# Patient Record
Sex: Female | Born: 1990 | Race: White | Hispanic: No | Marital: Single | Smoking: Former smoker
Health system: Southern US, Community
[De-identification: ages and names within clinical notes are randomized; demographics above are authoritative.]

## PROBLEM LIST (undated history)

## (undated) DIAGNOSIS — F1910 Other psychoactive substance abuse, uncomplicated: Secondary | ICD-10-CM

## (undated) DIAGNOSIS — J45909 Unspecified asthma, uncomplicated: Secondary | ICD-10-CM

## (undated) HISTORY — PX: TONSILLECTOMY: SUR1361

## (undated) HISTORY — PX: ADENOIDECTOMY: SUR15

## (undated) HISTORY — PX: WISDOM TOOTH EXTRACTION: SHX21

---

## 2016-02-28 ENCOUNTER — Emergency Department (HOSPITAL_COMMUNITY)
Admission: EM | Admit: 2016-02-28 | Discharge: 2016-02-28 | Disposition: A | Discharge status: Home / Self Care | Payer: Self-pay | Attending: Emergency Medicine | Admitting: Emergency Medicine

## 2016-02-28 ENCOUNTER — Encounter (HOSPITAL_COMMUNITY): Payer: Self-pay | Admitting: *Deleted

## 2016-02-28 DIAGNOSIS — Z880 Allergy status to penicillin: Secondary | ICD-10-CM | POA: Insufficient documentation

## 2016-02-28 DIAGNOSIS — J45909 Unspecified asthma, uncomplicated: Secondary | ICD-10-CM | POA: Insufficient documentation

## 2016-02-28 DIAGNOSIS — F1123 Opioid dependence with withdrawal: Secondary | ICD-10-CM | POA: Insufficient documentation

## 2016-02-28 DIAGNOSIS — F1193 Opioid use, unspecified with withdrawal: Secondary | ICD-10-CM

## 2016-02-28 DIAGNOSIS — F17200 Nicotine dependence, unspecified, uncomplicated: Secondary | ICD-10-CM | POA: Insufficient documentation

## 2016-02-28 HISTORY — DX: Unspecified asthma, uncomplicated: J45.909

## 2016-02-28 MED ORDER — BACLOFEN 10 MG PO TABS
5.0000 mg | ORAL_TABLET | Freq: Three times a day (TID) | ORAL | Status: DC
Start: 2016-02-28 — End: 2016-11-24

## 2016-02-28 MED ORDER — DIPHENOXYLATE-ATROPINE 2.5-0.025 MG PO TABS: 2.0000 | ORAL_TABLET | Freq: Four times a day (QID) | ORAL | Status: DC | PRN

## 2016-02-28 MED ORDER — PROMETHAZINE HCL 25 MG PO TABS: 25.0000 mg | ORAL_TABLET | Freq: Four times a day (QID) | ORAL | Status: DC | PRN

## 2016-02-28 NOTE — ED Notes (Signed)
Pt states that she is requesting Detox from Heroin; pt states that she last used 1.5 days ago; pt denies SI / HI

## 2016-02-28 NOTE — ED Provider Notes (Signed)
CSN: [id number]     Arrival date & time 02/28/16  2217 History   First MD Initiated Contact with Patient 02/28/16 2240     Chief Complaint  Patient presents with  . 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 Last use was a day and a half ago. Denies any suicidal homicidal ideations. Injects heroin into her left antecubital fossa. Has had some myalgias but denies any vomiting or diarrhea. No hallucinations. Has never been in rehabilitation before the past. Does not use alcohol daily. No treatment use prior to arrival  The history is provided by the patient and a parent.    Past Medical History  Diagnosis Date  . Asthma    History reviewed. No pertinent past surgical history. No family history on file. Social History  Substance Use Topics  . Smoking status: Current Every Day Smoker  . Smokeless tobacco: None  . Alcohol Use: Yes   OB History    No data available     Review of Systems  All other systems reviewed and are negative.     Allergies  Clindamycin/lincomycin and Penicillins  Home Medications   Prior to Admission medications   Not on File   BP 115/89 mmHg  Pulse 87  Temp(Src) 98.1 F (36.7 C) (Oral)  Resp 18  SpO2 97% Physical Exam  Constitutional: She is oriented to person, place, and time. She appears well-developed and well-nourished.  Non-toxic appearance. No distress.  HENT:  Head: Normocephalic and atraumatic.  Eyes: Conjunctivae, EOM and lids are normal. Pupils are equal, round, and reactive to light.  Neck: Normal range of motion. Neck supple. No tracheal deviation present. No thyroid mass present.  Cardiovascular: Normal rate, regular rhythm and normal heart sounds.  Exam reveals no gallop.   No murmur heard. Pulmonary/Chest: Effort normal and breath sounds normal. No stridor. No respiratory distress. She has no decreased  breath sounds. She has no wheezes. She has no rhonchi. She has no rales.  Abdominal: Soft. Normal appearance and bowel sounds are normal. She exhibits no distension. There is no tenderness. There is no rebound and no CVA tenderness.  Musculoskeletal: Normal range of motion. She exhibits no edema or tenderness.  Neurological: She is alert and oriented to person, place, and time. She has normal strength. No cranial nerve deficit or sensory deficit. GCS eye subscore is 4. GCS verbal subscore is 5. GCS motor subscore is 6.  Skin: Skin is warm and dry. No abrasion and no rash noted.  Psychiatric: Her speech is normal and behavior is normal. Her affect is blunt. She expresses no suicidal plans and no homicidal plans.  Nursing note and vitals reviewed.   ED Course  Procedures (including critical care time) Labs Review Labs Reviewed - No data to display  Imaging Review No results found. I have personally reviewed and evaluated these images and lab results as part of my medical decision-making.   EKG Interpretation None      MDM   Final diagnoses:  None    Lengthy discussion with patient and her family. And we'll treat patient for symptomatic opiate withdrawal and give outpatient referrals    Lorre NickAnthony Esmirna Ravan, MD 02/28/16 2259

## 2016-02-28 NOTE — Discharge Instructions (Signed)
Opioid Withdrawal Opioids are a group of narcotic drugs. They include the street drug heroin. They also include pain medicines, such as morphine, hydrocodone, oxycodone, and fentanyl. Opioid withdrawal is a group of characteristic physical and mental signs and symptoms. It typically occurs if you have been using opioids daily for several weeks or longer and stop using or rapidly decrease use. Opioid withdrawal can also occur if you have used opioids daily for a long time and are given a medicine to block the effect.  SIGNS AND SYMPTOMS Opioid withdrawal includes three or more of the following symptoms:   Depressed, anxious, or irritable mood.  Nausea or vomiting.  Muscle aches or spasms.   Watery eyes.   Runny nose.  Dilated pupils, sweating, or hairs standing on end.  Diarrhea or intestinal cramping.  Yawning.   Fever.  Increased blood pressure.  Fast pulse.  Restlessness or trouble sleeping. These signs and symptoms occur within several hours of stopping or reducing short-acting opioids, such as heroin. They can occur within 3 days of stopping or reducing long-acting opioids, such as methadone. Withdrawal begins within minutes of receiving a drug that blocks the effects of opioids, such as naltrexone or naloxone. DIAGNOSIS  Opioid use disorder is diagnosed by your health care provider. You will be asked about your symptoms, drug and alcohol use, medical history, and use of medicines. A physical exam may be done. Lab tests may be ordered. Your health care provider may have you see a mental health professional.  TREATMENT  The treatment for opioid withdrawal is usually provided by medical doctors with special training in substance use disorders (addiction specialists). The following medicines may be included in treatment:  Opioids given in place of the abused opioid. They turn on opioid receptors in the brain and lessen or prevent withdrawal symptoms. They are gradually  decreased (opioid substitution and taper).  Non-opioids that can lessen certain opioid withdrawal symptoms. They may be used alone or with opioid substitution and taper. Successful long-term recovery usually requires medicine, counseling, and group support. HOME CARE INSTRUCTIONS   Take medicines only as directed by your health care provider.  Check with your health care provider before starting new medicines.  Keep all follow-up visits as directed by your health care provider. SEEK MEDICAL CARE IF:  You are not able to take your medicines as directed.  Your symptoms get worse.  You relapse. SEEK IMMEDIATE MEDICAL CARE IF:  You have serious thoughts about hurting yourself or others.  You have a seizure.  You lose consciousness.   This information is not intended to replace advice given to you by your health care provider. Make sure you discuss any questions you have with your health care provider.   Document Released: 11/06/2003 Document Revised: 11/24/2014 Document Reviewed: 11/16/2013 Elsevier Interactive Patient Education 2016 [employer]. [employer] Guide Outpatient Counseling/Substance Abuse Adult The United Ways 211 is a great source of information about community services available.  Access by dialing 2-1-1 from anywhere in [place], or by website -  PooledIncome.pl.   Other Local Resources (Updated 11/2015)  Crisis Hotlines   Services     Area Served  [employer]  Crisis Hotline, available 24 hours a day, 7 days a week: 620-467-2000 [facility], [place]   [facility]  Crisis Hotline, available 24 hours a day, 7 days a week: 9850515319 [facility], [place]  [facility]  Suicide Prevention Hotline, available 24 hours a day, 7 days a week: 902-389-6858 [facility]  [place], [place]  [employer], available 24 hours a day, 7 days a week: 612-042-7284 [facility], [place]   [facility] Access to  [place] Hotline, available 24 hours a day, 7 days a week: 478-016-6738 All   [facility]  Crisis Hotline, available 24 hours a day, 7 days a week: 808-194-4721 All   Other Local Resources (Updated 11/2015)  Outpatient Counseling/ Substance Abuse Programs  Services     Address and Phone Number  ADS (Alcohol and Drug Services)   Options include Individual counseling, group counseling, intensive outpatient program (several hours a day, several days a week)  Offers depression assessments  Provides methadone maintenance program [phone] [address] [place], [place] [address]   [facility]   Offers partial hospitalization/day treatment and DUI/DWI programs  [employer], private insurance [phone] [address] [place], [place] [address]  [facility]    Services include intensive outpatient program (several hours a day, several days a week), outpatient treatment, DUI/DWI services, family education  Also has some services specifically for [employer] transitional housing  [phone] [address] [place], [place] [address]     [facility]  [employer], private pay, and private insurance [phone] [address] [place], [place] [address]  [employer] [facility]  Services include individual counseling, substance abuse intensive outpatient program (several hours a day, several days a week), day treatment  Delene Loll, Medicaid, private insurance [phone] [address], [address] [place], [place] [address]  [facility]   Offers substance abuse intensive outpatient program (several hours a day, several days a week), partial hospitalization program [phone] [address] [place], [place] [address]  [phone] [address] [place], [place] [address]  [phone] [address] [place], [place] [address]  [phone], [address] [place], [place] [address]  [facility]  Individual counseling only  Accepts private insurance only [phone] [address] [place], [place] [address]  [facility]  Methadone maintenance program [phone] [address] [place], [place] [address]  [facility]  Walk-In Clinic providing substance abuse and mental health counseling  Accepts Medicaid, Medicare, private insurance  Offers sliding scale for uninsured [phone] [address] [place], [place]   [facility] [place], [employer].  Offers individual counseling, and intensive in-home services [phone] [address] [place], [place] [address]  [facility] individual counseling, family counseling, group therapy, domestic violence counseling, consumer credit counseling  Accepts Medicare, Medicaid, private insurance  Offers sliding scale for uninsured [phone] [address] [place], [place] [address]  [phone] [facility], [address] [place], [place] [address]  [facility]  Offers individual, family and group counseling  3 locations - [place], [place], and [place]  [phone]  [address] [place], [place] [address]  [address] [place], [place] [address]  [address] [place], [place] [address]  [facility]    Offers psychiatric assessment, 8-week Intensive Outpatient Program (several hours a day, several times a week, daytime or evenings), early recovery group, family Program, medication management  Private pay or private insurance only [phone], or  [phone] [address] [place], [place] [address]  [facility] [place] [employer] individual, couples and family counseling  Accepts Medicaid, private insurance, and sliding scale for uninsured [phone] [address] [place], [place] [address]  Len Blalockavid  Fuller, MD  Individual counseling  Private insurance [phone] [address] [place], [place] [address]  [facility]   Offers assessment, substance abuse treatment, and behavioral health treatment [phone] [address] [place], [place] [address]  [facility]  Individual counseling  Accepts private insurance [phone] [address] [place], [place] [address]  [facility]  Individual counseling  Delene Lollccepts Medicare, private insurance [phone] [address] [place], [place] [address]  [facility]    Offers intensive outpatient program (several hours a day, several times a week)  Private pay, private insurance [phone] [facility] [place], [place]  [facility]  Individual counseling  Medicare, private insurance [phone] [address], [address] [place], [place] [address]  [facility]    Offers intensive outpatient program (several hours a day, several times a week) and partial hospitalization program [phone] [address] [place], [place] [address]  Emerson MonteParrish McKinney, MD  Individual counseling [phone] [address], [address] [place], [place] [address]  [facility]  Offers Christian counseling to individuals, couples, and families  Accepts Medicare and private insurance; offers sliding scale for uninsured [phone] [place], [place] [address]  [facility]  [facility] [phone] [address] [place], [place] [address]  [facility]   Offers crisis counseling, individual counseling, group therapy, in-home therapy, domestic violence services, day treatment, DWI services, Administrator, artsCommunity Support Team (CST), Assertive Community Treatment Team (ACTT), substance abuse Intensive Outpatient Program (several hours a day, several  times a week)  2 locations - [place] and [place] [phone] [address] [place], [place] [address]  [phone] [address] [place], [place] [address]  [facility]     Individual counseling and group therapy  Accepts private insurance, [place]icare, [place] [phone] [address] [place], [place]  [facility]  Offers individual and family counseling  Offers LGBTQ services  Accepts private insurance and private pay [phone] [address] [place], [place] [address]  [facility]    Offers individual counseling, group therapy, and outpatient detox  Accepts private insurance [phone] [address] [place], [place]  [facility]  Individual counseling  Accepts Medicare, private insurance [phone] [address] [place], [place] [address]  [facility]  Individual counseling  Accepts Medicare, private insurance [phone] [address] [place], [place] [address]  [facility]   Offers substance abuse Intensive Outpatient Program (several hours a day, several times a week) [phone], or [phone] [place], [place]

## 2016-11-18 ENCOUNTER — Emergency Department (HOSPITAL_COMMUNITY): Payer: 59

## 2016-11-18 ENCOUNTER — Encounter (HOSPITAL_COMMUNITY): Payer: Self-pay

## 2016-11-18 ENCOUNTER — Emergency Department (HOSPITAL_COMMUNITY)
Admission: EM | Admit: 2016-11-18 | Discharge: 2016-11-18 | Disposition: A | Discharge status: Home / Self Care | Payer: 59 | Attending: Emergency Medicine | Admitting: Emergency Medicine

## 2016-11-18 DIAGNOSIS — Z87891 Personal history of nicotine dependence: Secondary | ICD-10-CM | POA: Diagnosis not present

## 2016-11-18 DIAGNOSIS — J45909 Unspecified asthma, uncomplicated: Secondary | ICD-10-CM | POA: Diagnosis not present

## 2016-11-18 DIAGNOSIS — Z91010 Allergy to peanuts: Secondary | ICD-10-CM | POA: Insufficient documentation

## 2016-11-18 DIAGNOSIS — T402X1A Poisoning by other opioids, accidental (unintentional), initial encounter: Secondary | ICD-10-CM | POA: Insufficient documentation

## 2016-11-18 DIAGNOSIS — Z79899 Other long term (current) drug therapy: Secondary | ICD-10-CM | POA: Diagnosis not present

## 2016-11-18 DIAGNOSIS — Y638 Failure in dosage during other surgical and medical care: Secondary | ICD-10-CM | POA: Diagnosis not present

## 2016-11-18 DIAGNOSIS — D72829 Elevated white blood cell count, unspecified: Secondary | ICD-10-CM

## 2016-11-18 DIAGNOSIS — T40601A Poisoning by unspecified narcotics, accidental (unintentional), initial encounter: Secondary | ICD-10-CM

## 2016-11-18 HISTORY — DX: Other psychoactive substance abuse, uncomplicated: F19.10

## 2016-11-18 LAB — URINALYSIS, ROUTINE W REFLEX MICROSCOPIC
Bilirubin Urine: NEGATIVE
Glucose, UA: >=500 mg/dL — AB
Hgb urine dipstick: NEGATIVE
Ketones, ur: 20 mg/dL — AB
Leukocytes, UA: NEGATIVE
Nitrite: NEGATIVE
Protein, ur: NEGATIVE mg/dL
Specific Gravity, Urine: 1.01 (ref 1.005–1.030)
pH: 5 (ref 5.0–8.0)

## 2016-11-18 LAB — CBC WITH DIFFERENTIAL/PLATELET
Basophils Absolute: 0 10*3/uL (ref 0.0–0.1)
Basophils Relative: 0 %
Eosinophils Absolute: 0 10*3/uL (ref 0.0–0.7)
Eosinophils Relative: 0 %
HCT: 33.2 % — ABNORMAL LOW (ref 36.0–46.0)
Hemoglobin: 11.2 g/dL — ABNORMAL LOW (ref 12.0–15.0)
Lymphocytes Relative: 5 %
Lymphs Abs: 2.4 10*3/uL (ref 0.7–4.0)
MCH: 30.6 pg (ref 26.0–34.0)
MCHC: 33.7 g/dL (ref 30.0–36.0)
MCV: 90.7 fL (ref 78.0–100.0)
Monocytes Absolute: 3.4 10*3/uL — ABNORMAL HIGH (ref 0.1–1.0)
Monocytes Relative: 7 %
Neutro Abs: 42.3 10*3/uL — ABNORMAL HIGH (ref 1.7–7.7)
Neutrophils Relative %: 88 %
Platelets: 605 10*3/uL — ABNORMAL HIGH (ref 150–400)
RBC: 3.66 MIL/uL — ABNORMAL LOW (ref 3.87–5.11)
RDW: 11.4 % — ABNORMAL LOW (ref 11.5–15.5)
WBC: 48.1 10*3/uL — ABNORMAL HIGH (ref 4.0–10.5)

## 2016-11-18 LAB — BASIC METABOLIC PANEL
Anion gap: 13 (ref 5–15)
BUN: 15 mg/dL (ref 6–20)
CO2: 26 mmol/L (ref 22–32)
Calcium: 9.2 mg/dL (ref 8.9–10.3)
Chloride: 99 mmol/L — ABNORMAL LOW (ref 101–111)
Creatinine, Ser: 0.94 mg/dL (ref 0.44–1.00)
GFR calc Af Amer: >60 mL/min (ref >60)
GFR calc non Af Amer: >60 mL/min (ref >60)
Glucose, Bld: 203 mg/dL — ABNORMAL HIGH (ref 65–99)
Potassium: 4 mmol/L (ref 3.5–5.1)
Sodium: 138 mmol/L (ref 135–145)

## 2016-11-18 LAB — MAGNESIUM: Magnesium: 2.2 mg/dL (ref 1.7–2.4)

## 2016-11-18 LAB — RAPID URINE DRUG SCREEN, HOSP PERFORMED
Amphetamines: NOT DETECTED
Barbiturates: NOT DETECTED
Benzodiazepines: NOT DETECTED
Cocaine: NOT DETECTED
Opiates: POSITIVE — AB
Tetrahydrocannabinol: POSITIVE — AB

## 2016-11-18 LAB — I-STAT CG4 LACTIC ACID, ED
Lactic Acid, Venous: 3.27 mmol/L (ref 0.5–1.9)
Lactic Acid, Venous: 0.86 mmol/L (ref 0.5–1.9)

## 2016-11-18 LAB — SAVE SMEAR

## 2016-11-18 LAB — PREGNANCY, URINE: Preg Test, Ur: NEGATIVE

## 2016-11-18 MED ORDER — ONDANSETRON HCL 4 MG/2ML IJ SOLN
4.0000 mg | Freq: Once | INTRAMUSCULAR | Status: AC
  Administered 2016-11-18: 4 mg via INTRAVENOUS
  Filled 2016-11-18: qty 2

## 2016-11-18 MED ORDER — SODIUM CHLORIDE 0.9 % IV BOLUS (SEPSIS)
1000.0000 mL | Freq: Once | INTRAVENOUS | Status: AC
  Administered 2016-11-18: 1000 mL via INTRAVENOUS

## 2016-11-18 NOTE — ED Triage Notes (Signed)
Per EMS- patient reported that she snorted heroin at approx 2200 last night. Patient states she has been sober for 8 months until last night. Patient had snoring respirations when the fire dept arrived. A NPA was placed and patient received Narcan 2 mg IM. When EMS arrived, Patient was breathing on her own.

## 2016-11-18 NOTE — ED Notes (Signed)
Pt ambulated to the restroom with no assistance.

## 2016-11-18 NOTE — ED Notes (Signed)
Patient transported to X-ray 

## 2016-11-18 NOTE — ED Provider Notes (Signed)
[facility]-EMERGENCY DEPT Provider Note   CSN: [id number] Arrival date & time: 11/18/16  0849     History   Chief Complaint Chief Complaint  Patient presents with  . Drug Overdose    HPI Marty Sadlowski is a 26 y.o. female.  HPI   26 year old female presenting after accidental drug overdose. Patient states that she snorted heroin at approximately 10 PM yesterday. Found by family shortly before arrival poorly responsive. Fire was first to respond the scene and found him snoring respirations. They administered 2 mg of Narcan IM. By the time EMS arrived, patient was awake and breathing on her own. She denies any other ingestion. She has no acute complaints otherwise. She reports that she was sober for approximately 8 months until she relapsed yesterday. Denies any hallucinations.  Thoughts are one hurt anybody  Past Medical History:  Diagnosis Date  . Asthma   . Substance abuse     There are no active problems to display for this patient.   Past Surgical History:  Procedure Laterality Date  . ADENOIDECTOMY    . TONSILLECTOMY      OB History    No data available       Home Medications    Prior to Admission medications   Medication Sig Start Date End Date Taking? Authorizing Provider  baclofen (LIORESAL) 10 MG tablet Take 0.5 tablets (5 mg total) by mouth 3 (three) times daily. 02/28/16   Lorre Nick, MD  diphenoxylate-atropine (LOMOTIL) 2.5-0.025 MG tablet Take 2 tablets by mouth 4 (four) times daily as needed for diarrhea or loose stools. 02/28/16   Lorre Nick, MD  doxycycline (VIBRAMYCIN) 100 MG capsule Take 100 mg by mouth 2 (two) times daily.    Historical Provider, MD  promethazine (PHENERGAN) 25 MG tablet Take 1 tablet (25 mg total) by mouth every 6 (six) hours as needed for nausea or vomiting. 02/28/16   Lorre Nick, MD    Family History History reviewed. No pertinent family history.  Social History Social History  Substance Use Topics  . Smoking status:  Former Games developer  . Smokeless tobacco: Former Neurosurgeon  . Alcohol use No     Allergies   Clindamycin/lincomycin; Peanut-containing drug products; Penicillins; and Amoxicillin-pot clavulanate   Review of Systems Review of Systems   All systems reviewed and negative, other than as noted in HPI.    Physical Exam Updated Vital Signs BP 102/66   Pulse 95   Temp 98.9 F (37.2 C) (Rectal)   Resp 16   Ht 5\' 3"  (1.6 m)   Wt 135 lb (61.2 kg)   SpO2 98%   BMI 23.91 kg/m   Physical Exam  Constitutional: She is oriented to person, place, and time. She appears well-developed and well-nourished. No distress.  HENT:  Head: Normocephalic and atraumatic.  Eyes: Conjunctivae are normal. Right eye exhibits no discharge. Left eye exhibits no discharge.  Neck: Neck supple.  Cardiovascular: Normal rate, regular rhythm and normal heart sounds.  Exam reveals no gallop and no friction rub.   No murmur heard. Pulmonary/Chest: Effort normal and breath sounds normal. No respiratory distress.  Abdominal: Soft. She exhibits no distension. There is no tenderness.  Musculoskeletal: She exhibits no edema or tenderness.  Neurological: She is oriented to person, place, and time. No cranial nerve deficit or sensory deficit. She exhibits normal muscle tone. Coordination normal.  Drowsy, but opens eyes to voice and answers questions appropriate. Follows commands. She is protecting her airway.  Skin: Skin is warm and  dry.  Psychiatric: Her behavior is normal. Thought content normal.  Nursing note and vitals reviewed.    ED Treatments / Results  Labs (all labs ordered are listed, but only abnormal results are displayed) Labs Reviewed  CBC WITH DIFFERENTIAL/PLATELET - Abnormal; Notable for the following:       Result Value   WBC 48.1 (*)    RBC 3.66 (*)    Hemoglobin 11.2 (*)    HCT 33.2 (*)    RDW 11.4 (*)    Platelets 605 (*)    Neutro Abs 42.3 (*)    Monocytes Absolute 3.4 (*)    All other  components within normal limits  BASIC METABOLIC PANEL - Abnormal; Notable for the following:    Chloride 99 (*)    Glucose, Bld 203 (*)    All other components within normal limits  I-STAT CG4 LACTIC ACID, ED - Abnormal; Notable for the following:    Lactic Acid, Venous 3.27 (*)    All other components within normal limits  CULTURE, BLOOD (ROUTINE X 2)  CULTURE, BLOOD (ROUTINE X 2)  MAGNESIUM  URINALYSIS, ROUTINE W REFLEX MICROSCOPIC  RAPID URINE DRUG SCREEN, HOSP PERFORMED  PREGNANCY, URINE  I-STAT CG4 LACTIC ACID, ED    EKG  EKG Interpretation  Date/Time:  Tuesday November 18 2016 09:03:26 EST Ventricular Rate:  120 PR Interval:    QRS Duration: 93 QT Interval:  356 QTC Calculation: 503 R Axis:   91 Text Interpretation:  Sinus tachycardia Consider right ventricular hypertrophy Borderline T abnormalities, diffuse leads Prolonged QT interval No old tracing to compare Confirmed by Rakhi Romagnoli  MD, Cornie Herrington [phone]([id number]) on 11/18/2016 9:30:42 AM       Radiology Dg Chest 2 View  Result Date: 11/18/2016 CLINICAL DATA:  Drug overdose, tachycardia and elevated white blood cell count. EXAM: CHEST  2 VIEW COMPARISON:  None. FINDINGS: The heart size and mediastinal contours are within normal limits. There is no evidence of pulmonary edema, consolidation, pneumothorax, nodule or pleural fluid. The visualized skeletal structures are unremarkable. IMPRESSION: No active cardiopulmonary disease. Electronically Signed   By: Irish LackGlenn  Yamagata M.D.   On: 11/18/2016 12:09    Procedures Procedures (including critical care time)  Medications Ordered in ED Medications  sodium chloride 0.9 % bolus 1,000 mL (0 mLs Intravenous Stopped 11/18/16 1110)  sodium chloride 0.9 % bolus 1,000 mL (1,000 mLs Intravenous New Bag/Given 11/18/16 1149)  ondansetron (ZOFRAN) injection 4 mg (4 mg Intravenous Given 11/18/16 1200)     Initial Impression / Assessment and Plan / ED Course  I have reviewed the triage vital signs and  the nursing notes.  Pertinent labs & imaging results that were available during my care of the patient were reviewed by me and considered in my medical decision making (see chart for details).  Clinical Course     26 year old female with accidental heroin overdose. She was observed after Narcan. She remains significantly stable and like to go. She denies any suicidal homicidal ideation. She is not psychotic. She has medical decision-making capability. Workup was significant for very high leukocytosis. Unsure the exact etiology of this. I doubt infectious process though. Discussed with patient that she absolutely needs to have this followed up in the need for repeat blood work. Emergent return precautions were discussed. Outpatient resources provided otherwise.  Final Clinical Impressions(s) / ED Diagnoses   Final diagnoses:  Opiate overdose, accidental or unintentional, initial encounter  Leukocytosis, unspecified type    New Prescriptions New Prescriptions  No medications on file     Raeford Razor, MD 11/25/16 1453

## 2016-11-18 NOTE — ED Notes (Signed)
Bed: RESA Expected date:  Expected time:  Means of arrival:  Comments: EMS Overdose 

## 2016-11-18 NOTE — Discharge Instructions (Signed)
Your white blood cell count today is markedly elevated. I feel you are safe to go home at this time, but this is something you absolutely need to follow-up on as soon as you can. At the very least, you need to have repeat blood work in the next week. See resources when you are interested in help for the drug abuse. Return to the ER immediately if you develop fever, shortness or breath, passing out, uncontrollable nausea and vomiting or anything else concerning to you.

## 2016-11-19 LAB — PATHOLOGIST SMEAR REVIEW

## 2016-11-20 ENCOUNTER — Telehealth: Payer: Self-pay | Admitting: Hematology

## 2016-11-20 ENCOUNTER — Encounter: Payer: Self-pay | Admitting: Hematology

## 2016-11-20 NOTE — Telephone Encounter (Signed)
Received a msg from Dr. Bertis RuddyGorsuch for the pt to see a hematologist. Appt has been schedule for the pt to see Dr. Candise CheKale on 1/8 at 830am. Pt aware to arrive 30 minutes early. Demographics verified.

## 2016-11-21 ENCOUNTER — Emergency Department (HOSPITAL_COMMUNITY)
Admission: EM | Admit: 2016-11-21 | Discharge: 2016-11-21 | Disposition: A | Discharge status: Other Acute Inpatient Hospital | Payer: 59 | Attending: Emergency Medicine | Admitting: Emergency Medicine

## 2016-11-21 ENCOUNTER — Encounter (HOSPITAL_COMMUNITY): Payer: Self-pay | Admitting: *Deleted

## 2016-11-21 ENCOUNTER — Inpatient Hospital Stay (HOSPITAL_COMMUNITY)
Admission: AD | Admit: 2016-11-21 | Discharge: 2016-11-24 | DRG: 885 | Disposition: A | Discharge status: Home / Self Care | Payer: 59 | Source: Intra-hospital | Attending: Psychiatry | Admitting: Psychiatry

## 2016-11-21 ENCOUNTER — Encounter (HOSPITAL_COMMUNITY): Payer: Self-pay | Admitting: Emergency Medicine

## 2016-11-21 ENCOUNTER — Emergency Department (HOSPITAL_COMMUNITY): Payer: 59

## 2016-11-21 DIAGNOSIS — Z881 Allergy status to other antibiotic agents status: Secondary | ICD-10-CM | POA: Diagnosis not present

## 2016-11-21 DIAGNOSIS — Z880 Allergy status to penicillin: Secondary | ICD-10-CM

## 2016-11-21 DIAGNOSIS — E876 Hypokalemia: Secondary | ICD-10-CM | POA: Insufficient documentation

## 2016-11-21 DIAGNOSIS — Z975 Presence of (intrauterine) contraceptive device: Secondary | ICD-10-CM | POA: Diagnosis not present

## 2016-11-21 DIAGNOSIS — F1910 Other psychoactive substance abuse, uncomplicated: Secondary | ICD-10-CM | POA: Diagnosis not present

## 2016-11-21 DIAGNOSIS — J45909 Unspecified asthma, uncomplicated: Secondary | ICD-10-CM | POA: Diagnosis present

## 2016-11-21 DIAGNOSIS — F322 Major depressive disorder, single episode, severe without psychotic features: Secondary | ICD-10-CM | POA: Diagnosis not present

## 2016-11-21 DIAGNOSIS — Z91010 Allergy to peanuts: Secondary | ICD-10-CM

## 2016-11-21 DIAGNOSIS — F1120 Opioid dependence, uncomplicated: Secondary | ICD-10-CM | POA: Diagnosis present

## 2016-11-21 DIAGNOSIS — T401X1A Poisoning by heroin, accidental (unintentional), initial encounter: Secondary | ICD-10-CM | POA: Diagnosis present

## 2016-11-21 DIAGNOSIS — Z87891 Personal history of nicotine dependence: Secondary | ICD-10-CM

## 2016-11-21 DIAGNOSIS — F11220 Opioid dependence with intoxication, uncomplicated: Secondary | ICD-10-CM

## 2016-11-21 DIAGNOSIS — F411 Generalized anxiety disorder: Secondary | ICD-10-CM | POA: Diagnosis present

## 2016-11-21 DIAGNOSIS — T401X4A Poisoning by heroin, undetermined, initial encounter: Secondary | ICD-10-CM | POA: Diagnosis not present

## 2016-11-21 DIAGNOSIS — F332 Major depressive disorder, recurrent severe without psychotic features: Secondary | ICD-10-CM | POA: Diagnosis present

## 2016-11-21 DIAGNOSIS — R7989 Other specified abnormal findings of blood chemistry: Secondary | ICD-10-CM | POA: Clinically undetermined

## 2016-11-21 DIAGNOSIS — T401X1D Poisoning by heroin, accidental (unintentional), subsequent encounter: Secondary | ICD-10-CM

## 2016-11-21 DIAGNOSIS — T402X1D Poisoning by other opioids, accidental (unintentional), subsequent encounter: Secondary | ICD-10-CM

## 2016-11-21 DIAGNOSIS — Z79899 Other long term (current) drug therapy: Secondary | ICD-10-CM | POA: Diagnosis not present

## 2016-11-21 DIAGNOSIS — R946 Abnormal results of thyroid function studies: Secondary | ICD-10-CM | POA: Diagnosis not present

## 2016-11-21 DIAGNOSIS — Z9889 Other specified postprocedural states: Secondary | ICD-10-CM | POA: Diagnosis not present

## 2016-11-21 DIAGNOSIS — Z888 Allergy status to other drugs, medicaments and biological substances status: Secondary | ICD-10-CM | POA: Diagnosis not present

## 2016-11-21 LAB — URINALYSIS, ROUTINE W REFLEX MICROSCOPIC
BILIRUBIN URINE: NEGATIVE
Bacteria, UA: NONE SEEN
HGB URINE DIPSTICK: NEGATIVE
Ketones, ur: 5 mg/dL — AB
LEUKOCYTES UA: NEGATIVE
Nitrite: NEGATIVE
PH: 6 (ref 5.0–8.0)
Protein, ur: NEGATIVE mg/dL
SPECIFIC GRAVITY, URINE: 1.005 (ref 1.005–1.030)

## 2016-11-21 LAB — COMPREHENSIVE METABOLIC PANEL
ALT: 63 U/L — AB (ref 14–54)
AST: 51 U/L — AB (ref 15–41)
Albumin: 4.1 g/dL (ref 3.5–5.0)
Alkaline Phosphatase: 59 U/L (ref 38–126)
Anion gap: 13 (ref 5–15)
BUN: 10 mg/dL (ref 6–20)
CHLORIDE: 98 mmol/L — AB (ref 101–111)
CO2: 28 mmol/L (ref 22–32)
CREATININE: 0.91 mg/dL (ref 0.44–1.00)
Calcium: 9.1 mg/dL (ref 8.9–10.3)
GFR calc Af Amer: >60 mL/min (ref >60)
GFR calc non Af Amer: >60 mL/min (ref >60)
Glucose, Bld: 181 mg/dL — ABNORMAL HIGH (ref 65–99)
POTASSIUM: 2.8 mmol/L — AB (ref 3.5–5.1)
SODIUM: 139 mmol/L (ref 135–145)
Total Bilirubin: 0.4 mg/dL (ref 0.3–1.2)
Total Protein: 7.1 g/dL (ref 6.5–8.1)

## 2016-11-21 LAB — CBC WITH DIFFERENTIAL/PLATELET
BASOS ABS: 0 10*3/uL (ref 0.0–0.1)
BASOS PCT: 0 %
Eosinophils Absolute: 0.1 10*3/uL (ref 0.0–0.7)
Eosinophils Relative: 1 %
HEMATOCRIT: 35.6 % — AB (ref 36.0–46.0)
HEMOGLOBIN: 12 g/dL (ref 12.0–15.0)
LYMPHS PCT: 10 %
Lymphs Abs: 2.1 10*3/uL (ref 0.7–4.0)
MCH: 30.2 pg (ref 26.0–34.0)
MCHC: 33.7 g/dL (ref 30.0–36.0)
MCV: 89.7 fL (ref 78.0–100.0)
MONO ABS: 1 10*3/uL (ref 0.1–1.0)
MONOS PCT: 5 %
NEUTROS PCT: 84 %
Neutro Abs: 18.9 10*3/uL — ABNORMAL HIGH (ref 1.7–7.7)
Platelets: 462 10*3/uL — ABNORMAL HIGH (ref 150–400)
RBC: 3.97 MIL/uL (ref 3.87–5.11)
RDW: 11.2 % — AB (ref 11.5–15.5)
WBC: 22.2 10*3/uL — ABNORMAL HIGH (ref 4.0–10.5)

## 2016-11-21 LAB — RAPID URINE DRUG SCREEN, HOSP PERFORMED
Amphetamines: NOT DETECTED
BARBITURATES: NOT DETECTED
Benzodiazepines: NOT DETECTED
COCAINE: NOT DETECTED
Opiates: NOT DETECTED
Tetrahydrocannabinol: NOT DETECTED

## 2016-11-21 LAB — I-STAT BETA HCG BLOOD, ED (MC, WL, AP ONLY): I-stat hCG, quantitative: <5 m[IU]/mL (ref <5)

## 2016-11-21 LAB — SALICYLATE LEVEL: Salicylate Lvl: <7 mg/dL (ref 2.8–30.0)

## 2016-11-21 LAB — ACETAMINOPHEN LEVEL

## 2016-11-21 MED ORDER — ACETAMINOPHEN 325 MG PO TABS
650.0000 mg | ORAL_TABLET | Freq: Four times a day (QID) | ORAL | Status: DC | PRN
Start: 2016-11-21 — End: 2016-11-24

## 2016-11-21 MED ORDER — HYDROXYZINE HCL 25 MG PO TABS
25.0000 mg | ORAL_TABLET | Freq: Three times a day (TID) | ORAL | Status: DC | PRN
  Administered 2016-11-23 (×2): 25 mg via ORAL
  Filled 2016-11-21 (×2): qty 1

## 2016-11-21 MED ORDER — POTASSIUM CHLORIDE CRYS ER 20 MEQ PO TBCR
40.0000 meq | EXTENDED_RELEASE_TABLET | Freq: Once | ORAL | Status: AC
  Administered 2016-11-21: 40 meq via ORAL
  Filled 2016-11-21: qty 2

## 2016-11-21 MED ORDER — SODIUM CHLORIDE 0.9 % IV BOLUS (SEPSIS)
1000.0000 mL | Freq: Once | INTRAVENOUS | Status: AC
Start: 2016-11-21 — End: 2016-11-21
  Administered 2016-11-21: 1000 mL via INTRAVENOUS

## 2016-11-21 MED ORDER — NICOTINE 21 MG/24HR TD PT24
21.0000 mg | MEDICATED_PATCH | Freq: Every day | TRANSDERMAL | Status: DC
  Administered 2016-11-22: 21 mg via TRANSDERMAL
  Filled 2016-11-21 (×3): qty 1

## 2016-11-21 MED ORDER — TRAZODONE HCL 50 MG PO TABS
50.0000 mg | ORAL_TABLET | Freq: Every evening | ORAL | Status: DC | PRN
  Administered 2016-11-23 (×2): 50 mg via ORAL
  Filled 2016-11-21 (×2): qty 1

## 2016-11-21 MED ORDER — ALUM & MAG HYDROXIDE-SIMETH 200-200-20 MG/5ML PO SUSP: 30.0000 mL | ORAL | Status: DC | PRN

## 2016-11-21 MED ORDER — MAGNESIUM HYDROXIDE 400 MG/5ML PO SUSP: 30.0000 mL | Freq: Every day | ORAL | Status: DC | PRN

## 2016-11-21 MED ORDER — LEVONORGESTREL 20 MCG/24HR IU IUD: 1.0000 | INTRAUTERINE_SYSTEM | Freq: Once | INTRAUTERINE | Status: DC

## 2016-11-21 MED ORDER — BACLOFEN 5 MG HALF TABLET
5.0000 mg | ORAL_TABLET | Freq: Three times a day (TID) | ORAL | Status: DC
  Administered 2016-11-21 – 2016-11-24 (×9): 5 mg via ORAL
  Filled 2016-11-21 (×15): qty 1

## 2016-11-21 MED ORDER — DOXYCYCLINE HYCLATE 100 MG PO TABS
100.0000 mg | ORAL_TABLET | Freq: Two times a day (BID) | ORAL | Status: AC
  Administered 2016-11-21 – 2016-11-23 (×4): 100 mg via ORAL
  Filled 2016-11-21 (×4): qty 1

## 2016-11-21 MED ORDER — MAGNESIUM OXIDE 400 (241.3 MG) MG PO TABS
800.0000 mg | ORAL_TABLET | Freq: Once | ORAL | Status: AC
  Administered 2016-11-21: 800 mg via ORAL
  Filled 2016-11-21: qty 2

## 2016-11-21 NOTE — ED Notes (Signed)
EDP requested that pt eats a meal prior to taking K+. Will admin after pt eats

## 2016-11-21 NOTE — ED Triage Notes (Signed)
Patient BIB [facility] home. Pt found unresponsive after snorting heroine. 2mg  IN narcan administered by [employer]. Assisted respirations for several minutes before return LOC. Pt now NSR, VSS. Recent overdose on 11/18/16

## 2016-11-21 NOTE — BH Assessment (Signed)
Assessment Note  Olivia Casey is an 26 y.o. female with history of substance abuse. Patient [facility] after a reported unintentional Heroin overdose. Patient lives with her paternal aunt and uncle. Sts that they called EMS [facility].  Patient denies that this was a suicide attempt or gestures. Denies history or prior suicide attempt/gestures. No self mutilating behaviors. She reports family conflict and drug use as a stressor. Patient explains that she was sober from Heroin use for approximately 10 months and relapsed this week. She also used Heroin Tuesday and again today; overdosing on both occasions. UDS is negative for opiates. Patient denies HI. She is calm and cooperative. No legal issues. No AVH's. Patient does not appear to be responding to internal stimuli. She has a history of 1 prior INPT hospitalization at [facility] for drug rehabilitation treatment. She also has a NA sponsor. No outpatient psychiatrist or therapist. No history of abuse. She considers her aunts and uncles as her support system.   Diagnosis: Major Depressive Disorder, Recurrent, Severe, without psychotic features and Substance Use Disorder   Past Medical History:  Past Medical History:  Diagnosis Date  . Asthma   . Substance abuse     Past Surgical History:  Procedure Laterality Date  . ADENOIDECTOMY    . TONSILLECTOMY      Family History: History reviewed. No pertinent family history.  Social History:  reports that she has quit smoking. She has quit using smokeless tobacco. She reports that she uses drugs. She reports that she does not drink alcohol.  Additional Social History:  Alcohol / Drug Use Pain Medications: SEE MAR Prescriptions: SEE MAR Over the Counter: SEE MAR History of alcohol / drug use?: Yes Negative Consequences of Use: Personal relationships Substance #1 Name of Substance 1: Heroin  1 - Age of First Use: 26 yrs old  1 - Amount (size/oz):  after 10 months of sobriety relapsed Monday 11/18/2016 and today 11/21/2016 1 - Frequency: 2x's in the past week after 10 months of sobriety 1 - Duration: on-going  1 - Last Use / Amount: 11/21/2016; "2 points" or "2 grams"  CIWA: CIWA-Ar BP: 115/87 Pulse Rate: 87 COWS:    Allergies:  Allergies  Allergen Reactions  . Clindamycin/Lincomycin Anaphylaxis  . Peanut-Containing Drug Products Anaphylaxis  . Penicillins Anaphylaxis    Has patient had a PCN reaction causing immediate rash, facial/tongue/throat swelling, SOB or lightheadedness with hypotension:unknown Has patient had a PCN reaction causing severe rash involving mucus membranes or skin necrosis: yes Has patient had a PCN reaction that required hospitalization: no Has patient had a PCN reaction occurring within the last 10 years: yes If all of the above answers are "NO", then may proceed with Cephalosporin use   . Amoxicillin-Pot Clavulanate     Home Medications:  (Not in a hospital admission)  OB/GYN Status:  No LMP recorded. Patient is not currently having periods (Reason: IUD).  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 : No Suicidal Intent: No Has patient had any suicidal intent within the past 6 months prior to admission? : No Is patient at risk for suicide?: No Suicidal Plan?: No Has patient had any suicidal plan within the past 6 months prior to admission? : No Access to Means: No What has been your use of drugs/alcohol within the last 12 months?:  (Heroin ) Previous Attempts/Gestures: No How many times?:  (0) Other Self Harm Risks:  (denies ) Triggers for Past Attempts: Other (Comment) (no previous suicide attempts and gestures ) Intentional Self Injurious Behavior: None Family Suicide History: No Recent stressful life event(s): Other (Comment) (family conflict around the Christmas Holidays; Heroin Relaps) Persecutory voices/beliefs?: No Depression: Yes Depression Symptoms: Feeling angry/irritable, Feeling worthless/self pity, Loss of interest in usual pleasures, Guilt, Fatigue, Isolating, Tearfulness, Insomnia, Despondent Substance abuse history and/or treatment for substance abuse?: No Suicide prevention information given to non-admitted patients: Not applicable  Risk to Others within the past 6 months Homicidal Ideation: No Does patient have any lifetime risk of violence toward others beyond the six months prior to admission? : No Thoughts of Harm to Others: No Current Homicidal Intent: No Current Homicidal Plan: No Access to Homicidal Means: No Identified Victim:  (n/a) History of harm to others?: No Assessment of Violence: None Noted Violent Behavior Description:  (patient is calm and cooperative ) Does patient have access to weapons?: No Criminal Charges Pending?:  No Does patient have a court date: No Is patient on probation?: No  Psychosis Hallucinations: None noted Delusions: None noted  Mental Status Report Appearance/Hygiene: In scrubs Eye Contact: Good Motor Activity: Freedom of movement Speech: Logical/coherent Level of Consciousness: Alert Mood: Depressed Affect: Appropriate to circumstance Anxiety Level: None Thought Processes: Coherent, Relevant Judgement: Impaired Orientation: Person, Place, Time, Situation Obsessive Compulsive Thoughts/Behaviors: None  Cognitive Functioning Concentration: Decreased Memory: Recent Intact, Remote Intact IQ: Average Insight: Poor Impulse Control: Poor Appetite: Fair Weight Loss:  (none reported) Weight Gain:  (none reported) Sleep: Decreased Total Hours of Sleep:  (varies ) Vegetative Symptoms: None  ADLScreening [facility] Assessment Services) Patient's cognitive ability adequate to safely complete daily activities?: Yes Patient able to express need for assistance with ADLs?: Yes Independently performs ADLs?: Yes (appropriate for developmental age)  Prior Inpatient Therapy Prior Inpatient Therapy: Yes Prior Therapy Dates:  (March 2017- [facility]) Prior Therapy Facilty/Provider(s):  [facility] ) Reason for Treatment:  (substance abuse rehab; Heroin)  Prior Outpatient Therapy Prior Outpatient Therapy: Yes Prior Therapy Dates:  (off and on for several years...[facility]..."I have a sponsor") Prior Therapy Facilty/Provider(s):  ([facility]) Reason for Treatment:  (depression ) Does patient have an ACCT team?: No Does patient have Intensive In-House Services?  : No Does patient have [facility] services? : No Does patient have P4CC services?: No  ADL Screening (condition at time of admission) Patient's cognitive ability adequate to safely complete daily activities?: Yes Is the patient deaf or have difficulty hearing?: No Does the patient have difficulty  seeing, even when wearing  glasses/contacts?: No Does the patient have difficulty concentrating, remembering, or making decisions?: Yes Patient able to express need for assistance with ADLs?: Yes Does the patient have difficulty dressing or bathing?: No Independently performs ADLs?: Yes (appropriate for developmental age) Does the patient have difficulty walking or climbing stairs?: No Weakness of Legs: None Weakness of Arms/Hands: None  Home Assistive Devices/Equipment Home Assistive Devices/Equipment: None    Abuse/Neglect Assessment (Assessment to be complete while patient is alone) Physical Abuse: Denies Verbal Abuse: Denies Sexual Abuse: Denies Exploitation of patient/patient's resources: Denies Self-Neglect: Denies Values / Beliefs Cultural Requests During Hospitalization: None Spiritual Requests During Hospitalization: None   Advance Directives (For Healthcare) Does Patient Have a Medical Advance Directive?: No Would patient like information on creating a medical advance directive?: No - Patient declined Nutrition Screen- [facility] Patient's home diet: Regular  Additional Information 1:1 In Past 12 Months?: No CIRT Risk: No Elopement Risk: No Does patient have medical clearance?: Yes     Disposition:  Disposition Initial Assessment Completed for this Encounter: Yes Disposition of Patient: Inpatient treatment program Type of inpatient treatment program: Adult (Dr. Jannifer Franklin and Malachy Chamber, NP recommend INPT treatment )  On Site Evaluation by:   Reviewed with Physician:    Melynda Ripple 11/21/2016 10:25 AM

## 2016-11-21 NOTE — ED Notes (Signed)
Bed: WA15 Expected date:  Expected time:  Means of arrival:  Comments: overdose 

## 2016-11-21 NOTE — Progress Notes (Signed)
Admission Note:  26 year old female who presents voluntary, in no acute distress, for the treatment of Substance Abuse. Patient reports that she overdosed twice on Heroin this week.  Patient reports overdoses as "accidental".  Patient presents pleasant and appropriate to circumstance. Patient was calm and cooperative with admission process. Patient currently denies SI and contracts for safety upon admission. Patient denies AVH.  Patient reports stressors as "normal holiday stress; getting ready for visitors etc" and "Getting over being sick with cold and flu symptoms".  Patient currently lives with her aunt and uncle and identifies them as her support system.  Patient reports last Heroin use this morning "2 or 3 points" worth.  Patient reports prior overdose on Tuesday of this week about the same amount.  Patient denies any other drug use, tobacco use, or alcohol use.  [facility], patient wants to work on "Not want to do drugs anymore" and "Be a better person".  Skin was assessed and found to be clear of any abnormal marks apart from bruise to right hip, "busted lip", and bruise to left forearm. Patient searched and no contraband found, POC and unit policies explained and understanding verbalized. Consents obtained.  Patient had no additional questions or concerns.

## 2016-11-21 NOTE — ED Notes (Addendum)
[facility] here to transport pt. IV taken out by Talmadge Coventryhornton prior to transport.

## 2016-11-21 NOTE — BH Assessment (Signed)
Dr. Jannifer FranklinAkintayo and Malachy Chamberakia Starkes, NP, recommend INPT treatment. Room assignment is 306-2.

## 2016-11-21 NOTE — Tx Team (Signed)
Initial Treatment Plan 11/21/2016 2:00 PM Olivia Casey [id number]    PATIENT STRESSORS: Substance abuse   PATIENT STRENGTHS: Ability for insight Average or above average intelligence Communication skills Motivation for treatment/growth Physical Health Supportive family/friends   PATIENT IDENTIFIED PROBLEMS: Substance Abuse  "Not want to do drugs anymore"  "Be a better person"                 DISCHARGE CRITERIA:  Ability to meet basic life and health needs Improved stabilization in mood, thinking, and/or behavior Motivation to continue treatment in a less acute level of care Need for constant or close observation no longer present Withdrawal symptoms are absent or subacute and managed without 24-hour nursing intervention  PRELIMINARY DISCHARGE PLAN: Attend 12-step recovery group Outpatient therapy Return to previous living arrangement  PATIENT/FAMILY INVOLVEMENT: This treatment plan has been presented to and reviewed with the patient, Olivia NossAlyssa Casey.  The patient and family have been given the opportunity to ask questions and make suggestions.  Olivia Casey, Olivia Hussein P, RN 11/21/2016, 2:00 PM

## 2016-11-21 NOTE — ED Notes (Signed)
Pt has sprite at bedside.

## 2016-11-21 NOTE — ED Provider Notes (Signed)
[facility]-EMERGENCY DEPT Provider Note   CSN: [id number] Arrival date & time: 11/21/16  4098     History   Chief Complaint Chief Complaint  Patient presents with  . Drug Overdose    HPI Olivia Casey is a 26 y.o. female.  HPI   Olivia Casey is a 26 y.o. female, with a history of Heroin use, asthma, and drug overdose, presenting to the ED with accidental drug overdose occurring just PTA. Patient was reportedly found with agonal respirations. Given 2 mg narcan IN via FD, which made the patient spontaneously responsive. FD assisted respirations for about 2 min. Pt admits to using heroin this morning. States she was not attempting to hurt herself. Denies other illicit drug use and alcohol use. Endorses a cough and congestion for the last 5 days. Endorses dysphoric mood over the last couple weeks. Denies shortness of breath, chest pain, nausea, abdominal pain, or any other complaints.    Before the OD on 1/2, patient states she was sober for 8 months.     Past Medical History:  Diagnosis Date  . Asthma   . Substance abuse     There are no active problems to display for this patient.   Past Surgical History:  Procedure Laterality Date  . ADENOIDECTOMY    . TONSILLECTOMY      OB History    No data available       Home Medications    Prior to Admission medications   Medication Sig Start Date End Date Taking? Authorizing Provider  doxycycline (VIBRAMYCIN) 100 MG capsule Take 100 mg by mouth 2 (two) times daily.   Yes Historical Provider, MD  ibuprofen (ADVIL,MOTRIN) 800 MG tablet Take 800 mg by mouth every 8 (eight) hours as needed for headache, mild pain or moderate pain.    Yes Historical Provider, MD  levonorgestrel (MIRENA) 20 MCG/24HR IUD 1 each by Intrauterine route once.   Yes Historical Provider, MD  Phenylephrine-DM (THERAFLU COLD/COUGH DAYTIME PO) Take 20 mLs by mouth as needed (cold).   Yes Historical Provider, MD  Phenylephrine-DM-GG-APAP (MUCINEX FAST-MAX COLD  FLU) 5-10-200-325 MG/10ML LIQD Take 20 mLs by mouth every 6 (six) hours as needed (cold).   Yes Historical Provider, MD  baclofen (LIORESAL) 10 MG tablet Take 0.5 tablets (5 mg total) by mouth 3 (three) times daily. Patient not taking: Reported on 11/21/2016 02/28/16   Lorre Nick, MD    Family History History reviewed. No pertinent family history.  Social History Social History  Substance Use Topics  . Smoking status: Former Games developer  . Smokeless tobacco: Former Neurosurgeon  . Alcohol use No     Allergies   Clindamycin/lincomycin; Peanut-containing drug products; Penicillins; and Amoxicillin-pot clavulanate   Review of Systems Review of Systems  Constitutional: Negative for chills and fever.       Heroin overdose  Respiratory: Positive for cough.   Cardiovascular: Negative for chest pain.  Gastrointestinal: Negative for abdominal pain, nausea and vomiting.  Psychiatric/Behavioral: Positive for dysphoric mood.  All other systems reviewed and are negative.    Physical Exam Updated Vital Signs BP 123/83   Pulse 81   Resp 14   SpO2 100%   Physical Exam  Constitutional: She is oriented to person, place, and time. She appears well-developed and well-nourished. No distress.  HENT:  Head: Normocephalic and atraumatic.  Eyes: Conjunctivae are normal.  Neck: Neck supple.  Cardiovascular: Normal rate, regular rhythm, normal heart sounds and intact distal pulses.   Pulmonary/Chest: Effort normal and breath sounds  normal. No respiratory distress.  Abdominal: Soft. There is no tenderness. There is no guarding.  Musculoskeletal: She exhibits no edema.  Lymphadenopathy:    She has no cervical adenopathy.  Neurological: She is alert and oriented to person, place, and time.  Skin: Skin is warm and dry. She is not diaphoretic.  Psychiatric: Her behavior is normal. She exhibits a depressed mood.  Nursing note and vitals reviewed.    ED Treatments / Results  Labs (all labs ordered are  listed, but only abnormal results are displayed) Labs Reviewed  COMPREHENSIVE METABOLIC PANEL - Abnormal; Notable for the following:       Result Value   Potassium 2.8 (*)    Chloride 98 (*)    Glucose, Bld 181 (*)    AST 51 (*)    ALT 63 (*)    All other components within normal limits  ACETAMINOPHEN LEVEL - Abnormal; Notable for the following:    Acetaminophen (Tylenol), Serum <10 (*)    All other components within normal limits  CBC WITH DIFFERENTIAL/PLATELET - Abnormal; Notable for the following:    WBC 22.2 (*)    HCT 35.6 (*)    RDW 11.2 (*)    Platelets 462 (*)    Neutro Abs 18.9 (*)    All other components within normal limits  URINALYSIS, ROUTINE W REFLEX MICROSCOPIC - Abnormal; Notable for the following:    Color, Urine STRAW (*)    Glucose, UA >=500 (*)    Ketones, ur 5 (*)    Squamous Epithelial / LPF 0-5 (*)    All other components within normal limits  SALICYLATE LEVEL  RAPID URINE DRUG SCREEN, HOSP PERFORMED  I-STAT BETA HCG BLOOD, ED ([facility], [facility], [facility] ONLY)    EKG  EKG Interpretation  Date/Time:  Friday November 21 2016 08:59:35 EST Ventricular Rate:  75 PR Interval:    QRS Duration: 88 QT Interval:  379 QTC Calculation: 424 R Axis:   58 Text Interpretation:  Sinus rhythm No significant change since last tracing Confirmed by FLOYD MD, DANIEL ([id number]) on 11/21/2016 9:02:07 AM       Radiology No results found.  Procedures Procedures (including critical care time)  Medications Ordered in ED Medications  sodium chloride 0.9 % bolus 1,000 mL (0 mLs Intravenous Stopped 11/21/16 0752)  potassium chloride SA (K-DUR,KLOR-CON) CR tablet 40 mEq (40 mEq Oral Given 11/21/16 0956)  magnesium oxide (MAG-OX) tablet 800 mg (800 mg Oral Given 11/21/16 0956)     Initial Impression / Assessment and Plan / ED Course  I have reviewed the triage vital signs and the nursing notes.  Pertinent labs & imaging results that were available during my care of the patient were reviewed  by me and considered in my medical decision making (see chart for details).  Clinical Course as of Nov 22 1051  Fri Nov 21, 2016  0812 WBC: (!) 22.2 [SJ]    Clinical Course User Index [SJ] Anselm PancoastShawn C Joy, PA-C     Patient presents following an accidental heroin overdose. Patient is alert and oriented upon her presentation to the ED. Improvement in leukocytosis from 3 days ago. Hypokalemia noted. No EKG changes. Patient states she is interested in addiction recovery. TTS consult placed. Patient medically cleared.   Vitals:   11/21/16 0645 11/21/16 0653 11/21/16 0700 11/21/16 0827  BP: 120/84 120/84 111/69 123/83  Pulse: 99 101 102 81  Resp: 15 14 14 14   SpO2: 100% 99% 97% 100%   Vitals:  11/21/16 0930 11/21/16 0936 11/21/16 0945 11/21/16 1024  BP: 106/76 106/76  115/87  Pulse: 93 94 102 87  Resp: 16 18 25 14   SpO2: 99% 97% 100% 99%       Final Clinical Impressions(s) / ED Diagnoses   Final diagnoses:  Accidental overdose of heroin, initial encounter  Hypokalemia    New Prescriptions New Prescriptions   No medications on file     Anselm Pancoast, PA-C 11/21/16 1054    Pricilla Loveless, MD 11/21/16 1727

## 2016-11-21 NOTE — ED Notes (Signed)
[facility]

## 2016-11-22 DIAGNOSIS — F332 Major depressive disorder, recurrent severe without psychotic features: Principal | ICD-10-CM

## 2016-11-22 DIAGNOSIS — Z888 Allergy status to other drugs, medicaments and biological substances status: Secondary | ICD-10-CM

## 2016-11-22 DIAGNOSIS — Z880 Allergy status to penicillin: Secondary | ICD-10-CM

## 2016-11-22 DIAGNOSIS — Z91010 Allergy to peanuts: Secondary | ICD-10-CM

## 2016-11-22 DIAGNOSIS — F11220 Opioid dependence with intoxication, uncomplicated: Secondary | ICD-10-CM

## 2016-11-22 DIAGNOSIS — Z79899 Other long term (current) drug therapy: Secondary | ICD-10-CM

## 2016-11-22 DIAGNOSIS — Z9889 Other specified postprocedural states: Secondary | ICD-10-CM

## 2016-11-22 LAB — LIPID PANEL
CHOL/HDL RATIO: 3.8 ratio
Cholesterol: 148 mg/dL (ref 0–200)
HDL: 39 mg/dL — ABNORMAL LOW (ref >40)
LDL Cholesterol: 83 mg/dL (ref 0–99)
Triglycerides: 129 mg/dL (ref <150)
VLDL: 26 mg/dL (ref 0–40)

## 2016-11-22 LAB — TSH: TSH: 0.156 u[IU]/mL — AB (ref 0.350–4.500)

## 2016-11-22 MED ORDER — NICOTINE POLACRILEX 2 MG MT GUM
2.0000 mg | CHEWING_GUM | OROMUCOSAL | Status: DC | PRN
  Administered 2016-11-22 – 2016-11-24 (×9): 2 mg via ORAL
  Filled 2016-11-22: qty 1

## 2016-11-22 NOTE — BHH Counselor (Signed)
Adult Comprehensive Assessment  Patient ID: Olivia Casey, female   DOB: 1991-10-29, 26 y.o.   MRN: [id number]  Information Source: Information source: Patient  Current Stressors:  Family Relationships: Yes. Don't have a relationship with mom.  Physical health (include injuries & life threatening diseases): Been sick since December 20th. Really sick over the holidays Substance abuse: relapse after 8 months clean from heroin. 2 Overdoses in the past week, Tuesday and Friday.  Living/Environment/Situation:  Living Arrangements: Other relatives Living conditions (as described by patient or guardian): Lives with aunt and uncle  How long has patient lived in current situation?: about 1 year What is atmosphere in current home:  Engineering geologist!)  Family History:  Marital status: Single Does patient have children?: No  Childhood History:  By whom was/is the patient raised?: Both parents Additional childhood history information: Mom and dad separated when patient was 31 years old Description of patient's relationship with caregiver when they were a child: Good. Dad was gone Theme park manager, Hotel manager Patient's description of current relationship with people who raised him/her: Good relationship with dad, but distant tense relationship with mom How were you disciplined when you got in trouble as a child/adolescent?: mom was very strict Does patient have siblings?: Yes Number of Siblings: 1 Description of patient's current relationship with siblings: Half brother who is 82; good relationship Did patient suffer any verbal/emotional/physical/sexual abuse as a child?: No Did patient suffer from severe childhood neglect?: No Has patient ever been sexually abused/assaulted/raped as an adolescent or adult?: No Was the patient ever a victim of a crime or a disaster?: No Witnessed domestic violence?: No Has patient been effected by domestic violence as an adult?: No  Education:  Highest grade of school patient has  completed: Masters Training and development officer in Audiological scientist Currently a Consulting civil engineer?: No Learning disability?: No  Employment/Work Situation:   Employment situation: Employed 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 How long has patient been employed?: 4 months Patient's job has been impacted by current illness: Yes Describe how patient's job has been impacted: missing 2 days this week What is the longest time patient has a held a job?: 4 years 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 Used about 2/10 of a gram on Tues and the same on Friday If attempted suicide, did drugs/alcohol play a role in this?: No (Patient affirms that this was not a suicide attempt) Alcohol/Substance Abuse Treatment Hx: Past Tx, Inpatient, Attends AA/NA, Past detox, Past Tx, Outpatient If yes, describe treatment: 30 day program with [facility] Has alcohol/substance abuse ever caused legal problems?: No  Social Support System:   Patient's Community Support System: Good Describe Community Support System: Family has been very supportive. Already seeking additional supports for recovery. 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 In what areas does patient  struggle / problems for  patient: Asking for help  Discharge Plan:   Does patient have access to transportation?: Yes (Aunt, uncle or boyfriend will pick up) Will patient be returning to same living situation after discharge?: Yes Currently receiving community mental health services: Yes (From Whom) If no, would patient like referral for services when discharged?: Yes (What [place] [facility]) Does patient have financial barriers related to discharge medications?: No  Summary/Recommendations:   Summary and Recommendations (to be completed by the evaluator): Patient is a 26 year old female who presented to the hospital due to opiate overdose. Patient reports primary triggers for admission was stressors around family and health. Patient will benefit from crisis stabilization medication evaluation, group therapy and psychoeducation in addition to case management for discharge planning. At discharge, it is recommended that patient remain compliant with established discharge plan and continued treatment  Olivia Casey. 11/22/2016

## 2016-11-22 NOTE — Progress Notes (Signed)
D: Patient's self inventory sheet: patient has fair sleep, no sleep medication.poor  Appetite, low energy level, poor concentration. Rated depression 3/10, hopeless 4/10, anxiety 0/10. SI/HI/AVH: Denies all. Physical complaints are body aches and generalized pain. Goal is getting through being sick. Plans to work on rest.   A: Medications administered, assessed medication knowledge and education given on medication regimen.  Emotional support and encouragement given patient. R: Denies SI and HI , contracts for safety. Safety maintained with 15 minute checks.

## 2016-11-22 NOTE — B[facility] Group Notes (Signed)
Nurse PsychoEducational Group and Goals review: Patient attended and actively participated, stating thather goal is to participate and be present.

## 2016-11-22 NOTE — Progress Notes (Signed)
Pt is flat isolative and withdrawn; Pt remained in bed with eyes closed all evening. Pt at the time of assessment did not look to be in any acute distress. Support, encouragement, and safe environment provided.  15-minute safety checks continue. Pt was unable answer most of the assessment questions.

## 2016-11-22 NOTE — H&P (Signed)
Psychiatric Admission Assessment Adult  Patient Identification: Olivia Casey  MRN:  [id number]  Date of Evaluation:  11/22/2016  Chief Complaint: Heroin overdose  Principal Diagnosis: Opioid use disorder, severe, dependence  Diagnosis:   Patient Active Problem List   Diagnosis Date Noted  . Substance abuse [F19.10] 11/21/2016  . Heroin overdose [T40.1X1A] 11/21/2016  . MDD (major depressive disorder), severe (HCC) [F32.2] 11/21/2016  . MDD (major depressive disorder), recurrent episode, severe (HCC) [F33.2] 11/21/2016   History of Present Illness: This is an admission assessment for this 26 year old Caucasian woman with history of Opioid use disorder. [facility] with complaints of accidental overdose on heroin. She is being admitted for evaluation & substance abuse treatment recommendations. During this assessment, Olivia Casey reports, "[facility]. My aunt & uncle that I live with called them. I had overdosed on heroin. That was my second heroin overdose this week. I had previously overdosed on Tuesday, 11-18-16 because I had a bad cold/bronchitis that were not responding to any of the treatments I was on since the 20th of December, 2017. I got frustrated, wanted to feel better. I decided to use some heroin. Prior to this, I had not used heroin in 8 months. So, overdosed on heroin on that Tuesday the 2nd. I was taken to the hospital, treated & released. Then, out of guilt & disappointtment for my relapse & almost dying of overdose, I used again yesterday to numb those feelings, that was when I overdosed again. I had abused pain pills in the past, experimented with drugs in high school. I have had substance abuse treatment at the [facility] treatment [facility] last summer. The treatment helped quite a bit. After discharge from here, I would resume the AA meetings, get a new sponsor, 1:1 counseling sessions  & may be, start the Vivitrol shots again. I'm not depressed". Olivia Casey currently denies any substance withdrawal symptoms.  Associated Signs/Symptoms: Depression Symptoms:  "I'm not really depressed, just having some gulit over my drug relapse & upsetting my aunt & uncle after my heroin overdose"  (Hypo) Manic Symptoms:  Impulsivity,  Anxiety Symptoms:  Excessive Worry,  Psychotic Symptoms:  Denies any hallucinations, delusional thoughts or paranoia.  PTSD Symptoms: Denies any PTSD symptoms or events.  Total Time spent with patient: 1 hour  Past Psychiatric History: Polysubstance dependence  Is the patient at risk to self? No.  Has the patient been a risk to self in the past 6 months? No.  Has the patient been a risk to self within the distant past? No.  Is the patient a risk to others? No.  Has the patient been a risk to others in the past 6 months? No.  Has the patient been a risk to others within the distant past? No.   Prior Inpatient Therapy:   Prior Outpatient Therapy:    Alcohol Screening: 1. How often do you have a drink containing alcohol?: Never 9. Have you or someone else been injured as a result of your drinking?: No 10. Has a relative or friend or a doctor or another health worker been concerned about your drinking or suggested you cut down?: No Alcohol Use Disorder Identification Test Final Score (AUDIT): 0 Brief Intervention: AUDIT score less than 7 or less-screening does not suggest unhealthy drinking-brief intervention not indicated  Substance Abuse History in the last 12 months:  Yes.    Consequences of Substance  Abuse: Medical Consequences:  Liver damage, Possible death by overdose Legal Consequences:  Arrests, jail time, Loss of driving privilege. Family Consequences:  Family discord, divorce and or separation.  Previous Psychotropic Medications: Denies any use of previous psychotropic medications.  Psychological Evaluations: Yes   Past Medical History:   Past Medical History:  Diagnosis Date  . Asthma   . Substance abuse     Past Surgical History:  Procedure Laterality Date  . ADENOIDECTOMY    . TONSILLECTOMY     Family History: History reviewed. No pertinent family history.  Family Psychiatric  History: Denies any familial hx of mental health or substance abuse issues.  Tobacco Screening: Have you used any form of tobacco in the last 30 days? (Cigarettes, Smokeless Tobacco, Cigars, and/or Pipes): No  Social History:  History  Alcohol Use No     History  Drug Use    Comment: heroin    Additional Social History: Marital status: Single Does patient have children?: No   Allergies:   Allergies  Allergen Reactions  . Clindamycin/Lincomycin Anaphylaxis  . Peanut-Containing Drug Products Anaphylaxis  . Penicillins Anaphylaxis    Has patient had a PCN reaction causing immediate rash, facial/tongue/throat swelling, SOB or lightheadedness with hypotension:unknown Has patient had a PCN reaction causing severe rash involving mucus membranes or skin necrosis: yes Has patient had a PCN reaction that required hospitalization: no Has patient had a PCN reaction occurring within the last 10 years: yes If all of the above answers are "NO", then may proceed with Cephalosporin use   . Amoxicillin-Pot Clavulanate    Lab Results:  Results for orders placed or performed during the hospital encounter of 11/21/16 (from the past 48 hour(s))  TSH     Status: Abnormal   Collection Time: 11/22/16  6:33 AM  Result Value Ref Range   TSH 0.156 (L) 0.350 - 4.500 uIU/mL    Comment: Performed by a 3rd Generation assay with a functional sensitivity of <=0.01 uIU/mL. Performed at [facility]    Blood Alcohol level:  No results found for: [facility]  Metabolic Disorder Labs:  No results found for: HGBA1C, MPG No results found for: PROLACTIN No results found for: CHOL, TRIG, HDL, CHOLHDL, VLDL, LDLCALC  Current Medications: Current  Facility-Administered Medications  Medication Dose Route Frequency Provider Last Rate Last Dose  . acetaminophen (TYLENOL) tablet 650 mg  650 mg Oral Q6H PRN Truman Hayward, FNP      . alum & mag hydroxide-simeth (MAALOX/MYLANTA) 200-200-20 MG/5ML suspension 30 mL  30 mL Oral Q4H PRN Truman Hayward, FNP      . baclofen (LIORESAL) tablet 5 mg  5 mg Oral TID Truman Hayward, FNP   5 mg at 11/22/16 0855  . doxycycline (VIBRA-TABS) tablet 100 mg  100 mg Oral BID Adonis Brook, NP   100 mg at 11/22/16 0856  . hydrOXYzine (ATARAX/VISTARIL) tablet 25 mg  25 mg Oral TID PRN Truman Hayward, FNP      . magnesium hydroxide (MILK OF MAGNESIA) suspension 30 mL  30 mL Oral Daily PRN Truman Hayward, FNP      . nicotine (NICODERM CQ - dosed in mg/24 hours) patch 21 mg  21 mg Transdermal Q0600 Truman Hayward, FNP   21 mg at 11/22/16 0853  . traZODone (DESYREL) tablet 50 mg  50 mg Oral QHS PRN Truman Hayward, FNP       PTA Medications: Prescriptions Prior to Admission  Medication Sig Dispense  Refill Last Dose  . baclofen (LIORESAL) 10 MG tablet Take 0.5 tablets (5 mg total) by mouth 3 (three) times daily. (Patient not taking: Reported on 11/21/2016) 20 each 0 Completed Course at Unknown time  . doxycycline (VIBRAMYCIN) 100 MG capsule Take 100 mg by mouth 2 (two) times daily.   Past Week at Unknown time  . ibuprofen (ADVIL,MOTRIN) 800 MG tablet Take 800 mg by mouth every 8 (eight) hours as needed for headache, mild pain or moderate pain.    Past Week at Unknown time  . levonorgestrel (MIRENA) 20 MCG/24HR IUD 1 each by Intrauterine route once.     Marland Kitchen Phenylephrine-DM (THERAFLU COLD/COUGH DAYTIME PO) Take 20 mLs by mouth as needed (cold).   Past Week at Unknown time  . Phenylephrine-DM-GG-APAP (MUCINEX FAST-MAX COLD FLU) 5-10-200-325 MG/10ML LIQD Take 20 mLs by mouth every 6 (six) hours as needed (cold).   Past Week at Unknown time   Musculoskeletal: Strength & Muscle Tone: within normal limits Gait & Station:  normal Patient leans: N/A  Psychiatric Specialty Exam: Physical Exam  Constitutional: She appears well-developed.  HENT:  Head: Normocephalic.  Eyes: Pupils are equal, round, and reactive to light.  Neck: Normal range of motion.  Cardiovascular: Normal rate.   Respiratory: Effort normal.  GI: Soft.  Genitourinary:  Genitourinary Comments: Denies any issues in this area  Musculoskeletal: Normal range of motion.  Neurological: She is alert.  Skin: Skin is warm.    Review of Systems  Constitutional: Positive for chills and malaise/fatigue.  Eyes: Positive for blurred vision.  Respiratory: Negative.   Cardiovascular: Negative.   Gastrointestinal: Negative.   Genitourinary: Negative.   Musculoskeletal: Positive for joint pain and myalgias.  Skin: Negative.   Neurological: Positive for dizziness.  Endo/Heme/Allergies: Negative.   Psychiatric/Behavioral: Positive for depression and substance abuse (Hx Polysubstance dependence). Negative for hallucinations, memory loss and suicidal ideas. The patient is nervous/anxious and has insomnia.     Blood pressure 111/77, pulse 72, temperature 98.2 F (36.8 C), resp. rate 18, height 5\' 3"  (1.6 m), weight 61.2 kg (135 lb), SpO2 99 %.Body mass index is 23.91 kg/m.  Per Md's SRA. General Appearance: Casual  Eye Contact:  Fair  Speech:  Normal Rate  Volume:  Normal  Mood:  Dysphoric  Affect:  Constricted  Thought Process:  Goal Directed  Orientation:  Full (Time, Place, and Person)  Thought Content:  Logical  Suicidal Thoughts:  No  Homicidal Thoughts:  No  Memory:  Immediate;   Fair Recent;   Fair  Judgement:  Poor  Insight:  Shallow  Psychomotor Activity:  Normal  Concentration:  Concentration: Fair and Attention Span: Fair  Recall:  [employer] of Knowledge:  Fair  Language:  Fair  Akathisia:  Negative  Handed:  Right  AIMS (if indicated):     Assets:  Desire for Improvement  ADL's:  Intact  Cognition:  WNL  Sleep:  Number  of Hours: 6.75   Treatment Plan/Recommendations: 1. Admit for crisis management and stabilization, estimated length of stay 3-5 days.  2. Medication management to reduce current symptoms to base line and improve the patient's overall level of functioning: See MAR  3. Treat health problems as indicated.  4. Develop treatment plan to decrease risk of relapse upon discharge and the need for readmission.  5. Psycho-social education regarding relapse prevention and self care.  6. Health care follow up as needed for medical problems.  7. Review, reconcile, and reinstate any pertinent home medications  for other health issues where appropriate. 8. Call for consults with hospitalist for any additional specialty patient care services as needed.  Observation Level/Precautions:  15 minute checks  Laboratory:  Per ED, UDS positive for opioid & THC  Psychotherapy: Group sessions   Medications: See MAR.  Consultations: As needed  Discharge Concerns: Safety, maintaining sobriety    Estimated LOS: 2-4 days  Other: Admit to [address].    Physician Treatment Plan for Primary Diagnosis: <principal problem not specified> Long Term Goal(s): Improvement in symptoms so as ready for discharge  Short Term Goals: Ability to identify changes in lifestyle to reduce recurrence of condition will improve, Ability to verbalize feelings will improve, Ability to demonstrate self-control will improve and Ability to identify and develop effective coping behaviors will improve  Physician Treatment Plan for Secondary Diagnosis: Active Problems:   MDD (major depressive disorder), recurrent episode, severe (HCC)  Long Term Goal(s): Improvement in symptoms so as ready for discharge  Short Term Goals: Ability to identify changes in lifestyle to reduce recurrence of condition will improve, Ability to demonstrate self-control will improve, Ability to identify and develop effective coping behaviors will improve, Compliance with  prescribed medications will improve and Ability to identify triggers associated with substance abuse/mental health issues will improve  I certify that inpatient services furnished can reasonably be expected to improve the patient's condition.    Sanjuana Kava, NP, PMHNP, FNP-BC 1/6/201811:37 AM  I have examined the patient and agreed with the findings of H&P and treatment plan. I have also done suicide assessment on this patient.

## 2016-11-22 NOTE — [facility] Suicide Risk Assessment (Signed)
[facility]   Nursing information obtained from:  Patient Demographic factors:  Adolescent or young adult, Caucasian Current Mental Status:  Self-harm behaviors Loss Factors:  NA Historical Factors:  NA Risk Reduction Factors:  Living with another person, especially a relative, Positive social support  Total Time spent with patient: 1.5 hours Principal Problem: <principal problem not specified> Diagnosis:   Patient Active Problem List   Diagnosis Date Noted  . Substance abuse [F19.10] 11/21/2016  . Heroin overdose [T40.1X1A] 11/21/2016  . MDD (major depressive disorder), severe (HCC) [F32.2] 11/21/2016  . MDD (major depressive disorder), recurrent episode, severe (HCC) [F33.2] 11/21/2016   Subjective Data: Alert oriented, cooperative. Has relapsed to heroine.   Continued Clinical Symptoms:  Alcohol Use Disorder Identification Test Final Score (AUDIT): 0 The "Alcohol Use Disorders Identification Test", Guidelines for Use in Primary Care, Second Edition.  World Science writerHealth Organization [facility](WHO). Score between 0-7:  no or low risk or alcohol related problems. Score between 8-15:  moderate risk of alcohol related problems. Score between 16-19:  high risk of alcohol related problems. Score 20 or above:  warrants further diagnostic evaluation for alcohol dependence and treatment.   CLINICAL FACTORS:   Dysthymia Alcohol/Substance Abuse/Dependencies More than one psychiatric diagnosis Unstable or Poor Therapeutic Relationship   Musculoskeletal: Strength & Muscle Tone: within normal limits Gait & Station: normal Patient leans: no lean  Psychiatric Specialty Exam: Physical Exam  HENT:  Head: Normocephalic.  Skin: She is not diaphoretic.    Review of Systems  Cardiovascular: Negative for chest pain.  Gastrointestinal: Negative for nausea.  Psychiatric/Behavioral: Positive for depression.    Blood pressure 111/77, pulse 72, temperature 98.2 F (36.8 C),  resp. rate 18, height 5\' 3"  (1.6 m), weight 61.2 kg (135 lb), SpO2 99 %.Body mass index is 23.91 kg/m.  General Appearance: Casual  Eye Contact:  Fair  Speech:  Normal Rate  Volume:  Normal  Mood:  Dysphoric  Affect:  Constricted  Thought Process:  Goal Directed  Orientation:  Full (Time, Place, and Person)  Thought Content:  Logical  Suicidal Thoughts:  No  Homicidal Thoughts:  No  Memory:  Immediate;   Fair Recent;   Fair  Judgement:  Poor  Insight:  Shallow  Psychomotor Activity:  Normal  Concentration:  Concentration: Fair and Attention Span: Fair  Recall:  [employer]  Fund of Knowledge:  Fair  Language:  Fair  Akathisia:  Negative  Handed:  Right  AIMS (if indicated):     Assets:  Desire for Improvement  ADL's:  Intact  Cognition:  WNL  Sleep:  Number of Hours: 6.75      COGNITIVE FEATURES THAT CONTRIBUTE TO RISK:  Closed-mindedness    SUICIDE RISK:   Moderate:  Frequent suicidal ideation with limited intensity, and duration, some specificity in terms of plans, no associated intent, good self-control, limited dysphoria/symptomatology, some risk factors present, and identifiable protective factors, including available and accessible social support.   PLAN OF CARE: Admit for stabilization. Medication management and safety. Monitor withdrawals from heroine and detox if needed.  I certify that inpatient services furnished can reasonably be expected to improve the patient's condition.  Thresa RossAKHTAR, Hayat Warbington, MD 11/22/2016, 11:37 AM

## 2016-11-22 NOTE — [facility] Group Notes (Signed)
  [facility] LCSW Group Therapy Note  Date and  10:00 to 11:10 AM  Type of Therapy and Topic:  Group Therapy: Avoiding Self-Sabotaging and Enabling Behaviors  Participation Level:  Active  Participation Quality:  Attentive and Sharing  Affect:  Anxious and Depressed  Cognitive:  Appropriate  Insight:  Developing/Improving  Engagement in Therapy:  Developing/Improving   Therapeutic models used: Cognitive Behavioral Therapy,  Person-Centered Therapy and Motivational Interviewing  Modes of Intervention:  Discussion, Exploration, Orientation, Rapport Building, Socialization and Support   Summary of Progress/Problems:  The main focus of today's process group was for the patient to identify ways in which they have in the past sabotaged their own recovery.Patients discussed pros and cons of multiple self sabotaging methods including: Isolation, Negative Peer Group, Substance Use, Negative Self Talk, Rumination, Shame & Guilt and Noncompliance with Follow up.  Motivational Interviewing was utilized to identify motivation they may have for wanting to change. Patient engaged easily in group and shared at several points. Patient identified negative self talk as manifestation of guilt and shame and is invested in preparation stage of change.  Carney Bernatherine C Harrill, LCSW

## 2016-11-22 NOTE — Progress Notes (Signed)
The patient attended this evening's A. A. Meeting and was appropriate.  

## 2016-11-23 DIAGNOSIS — F1910 Other psychoactive substance abuse, uncomplicated: Secondary | ICD-10-CM

## 2016-11-23 DIAGNOSIS — T401X4A Poisoning by heroin, undetermined, initial encounter: Secondary | ICD-10-CM

## 2016-11-23 LAB — CULTURE, BLOOD (ROUTINE X 2)
Culture: NO GROWTH
Culture: NO GROWTH

## 2016-11-23 LAB — HEMOGLOBIN A1C
HEMOGLOBIN A1C: 5 % (ref 4.8–5.6)
MEAN PLASMA GLUCOSE: 97 mg/dL

## 2016-11-23 NOTE — Progress Notes (Signed)
D: Patient's self inventory sheet: patient has poor sleep, recieved sleep medication.poor  Appetite, low energy level, poor concentration. Rated depression 3/10, hopeless 3/10, anxiety 3/10. SI/HI/AVH: denies all. Physical complaints are denied. Goal is "attend groups and participate". Plans to work on "pay attention to the schedule".   A: Medications administered, assessed medication knowledge and education given on medication regimen.  Emotional support and encouragement given patient. R: Denies SI and HI , contracts for safety. Safety maintained with 15 minute checks.

## 2016-11-23 NOTE — Progress Notes (Signed)
The patient attended this evening's A. A. Meeting and was appropriate.  

## 2016-11-23 NOTE — Progress Notes (Signed)
D: Pt is flat isolative and withdrawn to room; Pt remained in bed with eyes closed all evening. Pt at the time of assessment did not look to be in any acute distress. Support, encouragement, and safe environment provided.  15-minute safety checks continue. Pt was unable answer most of the assessment questions.

## 2016-11-23 NOTE — [facility] Group Notes (Signed)
[facility] Group Notes:  (Nursing/MHT/Case Management/Adjunct)  Date:  11/23/2016  Time:  2:45 PM  Type of Therapy:  Nurse Education  Participation Level:  Did Not Attend   Almira Barenny G Anaka Beazer 11/23/2016, 2:45 PM

## 2016-11-23 NOTE — Progress Notes (Signed)
[facility] MD Progress Note  11/23/2016 4:11 PM Olivia Casey  MRN:  [id number]  Subjective:  Olivia Casey reports, "I did not sleep well last night. All I did was toss & turn. My mood is still good, just feeling very tired today".  Objective: Olivia Casey is seen, chart reviewed. She is alert, oriented x 3 & aware of situation. She is cooperative with staff. Visible on the unit, participating in the group sessions. She is denying any symptoms of depression, however, says did not sleep well last night. She denies any opioid withdrawal symptoms. She denies any SIHI, AVH, delusional thoughts or paranoia.  She appears to be in no apparent distress. Will increase the Trazodone to100 mg Q hs & add Hydroxyzine 25 mg at bedtime routinely.  Principal Problem: Opioid use disorder, dependence   Diagnosis:   Patient Active Problem List   Diagnosis Date Noted  . Opioid dependence with uncomplicated intoxication (HCC) [F11.220]   . Substance abuse [F19.10] 11/21/2016  . Heroin overdose [T40.1X1A] 11/21/2016  . MDD (major depressive disorder), severe (HCC) [F32.2] 11/21/2016  . MDD (major depressive disorder), recurrent episode, severe (HCC) [F33.2] 11/21/2016   Total Time spent with patient: 25 minutes  Past Psychiatric History: Opioid dependence  Past Medical History:  Past Medical History:  Diagnosis Date  . Asthma   . Substance abuse     Past Surgical History:  Procedure Laterality Date  . ADENOIDECTOMY    . TONSILLECTOMY     Family History: History reviewed. No pertinent family history.  Family Psychiatric  History: See H&P  Social History:  History  Alcohol Use No     History  Drug Use    Comment: heroin    Social History   Social History  . Marital status: Single    Spouse name: N/A  . Number of children: N/A  . Years of education: N/A   Social History Main Topics  . Smoking status: Former Games developer  . Smokeless tobacco: Former Neurosurgeon  . Alcohol use No  . Drug use:      Comment: heroin  .  Sexual activity: Not Asked   Other Topics Concern  . None   Social History Narrative  . None   Additional Social History:   Sleep: Poor  Appetite:  Fair  Current Medications: Current Facility-Administered Medications  Medication Dose Route Frequency Provider Last Rate Last Dose  . acetaminophen (TYLENOL) tablet 650 mg  650 mg Oral Q6H PRN Truman Hayward, FNP      . alum & mag hydroxide-simeth (MAALOX/MYLANTA) 200-200-20 MG/5ML suspension 30 mL  30 mL Oral Q4H PRN Truman Hayward, FNP      . baclofen (LIORESAL) tablet 5 mg  5 mg Oral TID Truman Hayward, FNP   5 mg at 11/23/16 1259  . hydrOXYzine (ATARAX/VISTARIL) tablet 25 mg  25 mg Oral TID PRN Truman Hayward, FNP   25 mg at 11/23/16 0118  . magnesium hydroxide (MILK OF MAGNESIA) suspension 30 mL  30 mL Oral Daily PRN Truman Hayward, FNP      . nicotine polacrilex (NICORETTE) gum 2 mg  2 mg Oral PRN Craige Cotta, MD   2 mg at 11/23/16 1548  . traZODone (DESYREL) tablet 50 mg  50 mg Oral QHS PRN Truman Hayward, FNP   50 mg at 11/23/16 0118    Lab Results:  Results for orders placed or performed during the hospital encounter of 11/21/16 (from the past 48 hour(s))  Hemoglobin A1c  Status: None   Collection Time: 11/22/16  6:33 AM  Result Value Ref Range   Hgb A1c MFr Bld 5.0 4.8 - 5.6 %    Comment: (NOTE)         Pre-diabetes: 5.7 - 6.4         Diabetes: >6.4         Glycemic control for adults with diabetes: <7.0    Mean Plasma Glucose 97 mg/dL    Comment: (NOTE) Performed At: [facility] [address] [place], [place] [id number] Mila Homer MD WU:[id number] Performed at [facility]   Lipid panel     Status: Abnormal   Collection Time: 11/22/16  6:33 AM  Result Value Ref Range   Cholesterol 148 0 - 200 mg/dL   Triglycerides 956 <213 mg/dL   HDL 39 (L) >08 mg/dL   Total CHOL/HDL Ratio 3.8 RATIO   VLDL 26 0 - 40 mg/dL   LDL Cholesterol 83 0 - 99 mg/dL    Comment:         Total Cholesterol/HDL:CHD Risk Coronary Heart Disease Risk Table                     Men   Women  1/2 Average Risk   3.4   3.3  Average Risk       5.0   4.4  2 X Average Risk   9.6   7.1  3 X Average Risk  23.4   11.0        Use the calculated Patient Ratio above and the CHD Risk Table to determine the patient's CHD Risk.        ATP III CLASSIFICATION (LDL):  <100     mg/dL   Optimal  657-846  mg/dL   Near or Above                    Optimal  130-159  mg/dL   Borderline  962-952  mg/dL   High  >841     mg/dL   Very High Performed at [facility]   TSH     Status: Abnormal   Collection Time: 11/22/16  6:33 AM  Result Value Ref Range   TSH 0.156 (L) 0.350 - 4.500 uIU/mL    Comment: Performed by a 3rd Generation assay with a functional sensitivity of <=0.01 uIU/mL. Performed at [facility]     Blood Alcohol level:  No results found for: [facility]  Metabolic Disorder Labs: Lab Results  Component Value Date   HGBA1C 5.0 11/22/2016   MPG 97 11/22/2016   No results found for: PROLACTIN Lab Results  Component Value Date   CHOL 148 11/22/2016   TRIG 129 11/22/2016   HDL 39 (L) 11/22/2016   CHOLHDL 3.8 11/22/2016   VLDL 26 11/22/2016   LDLCALC 83 11/22/2016    Physical Findings: AIMS: Facial and Oral Movements Muscles of Facial Expression: None, normal Lips and Perioral Area: None, normal Jaw: None, normal Tongue: None, normal,Extremity Movements Upper (arms, wrists, hands, fingers): None, normal Lower (legs, knees, ankles, toes): None, normal, Trunk Movements Neck, shoulders, hips: None, normal, Overall Severity Severity of abnormal movements (highest score from questions above): None, normal Incapacitation due to abnormal movements: None, normal Patient's awareness of abnormal movements (rate only patient's report): No Awareness, Dental Status Current problems with teeth and/or dentures?: No Does patient usually wear dentures?: No  CIWA:     COWS:  Musculoskeletal: Strength & Muscle Tone: within normal limits Gait & Station: normal Patient leans: N/A  Psychiatric Specialty Exam: Physical Exam  Review of Systems  Psychiatric/Behavioral: Positive for substance abuse (Opioid dependence). Negative for depression, hallucinations, memory loss and suicidal ideas. The patient is nervous/anxious and has insomnia.     Blood pressure 114/79, pulse 94, temperature 98.6 F (37 C), resp. rate 18, height 5\' 3"  (1.6 m), weight 61.2 kg (135 lb), SpO2 99 %.Body mass index is 23.91 kg/m.  General Appearance:Casual  Eye Contact: Fair  Speech: Normal Rate  Volume: Normal  Mood: Dysphoric  Affect: Constricted  Thought Process: Goal Directed  Orientation: Full (Time, Place, and Person)  Thought Content: Logical  Suicidal Thoughts: No  Homicidal Thoughts: No  Memory: Immediate; Fair Recent; Fair  Judgement: Poor  Insight: Shallow  Psychomotor Activity: Normal  Concentration: Concentration: Fairand Attention Span: Fair  Recall: [employer]  Fund of Knowledge: Fair  Language: Fair  Akathisia: Negative  Handed: Right  AIMS (if indicated):   Assets: Desire for Improvement  ADL's: Intact  Cognition: WNL  Sleep: Number of Hours: 3.5     Treatment Plan Summary: the stressors. Daily contact with patient to assess and evaluate symptoms and progress in treatment and Medication management  Reviewed past medical records & treatment plan.   For Depressed mood/anxiety: Will monitor for symptoms, currently denying being depressed..  For Anxiety disorder: Will continue Hydroxyzine 25 mg po Q 6 hours prn.  For insomnia: Will increase Trazodone to 100 mg po qhs prn. Will add Hydroxyzine 25 mg at bedtime.  Other medical issues & concerns: Will continue to monitor for any complaints.  - Continue 15 minutes observation for safety concerns - Encouraged to participate in milieu therapy and group therapy  counseling sessions and also work with coping skills -  Develop treatment plan to reduce the need for readmission. -  Psycho-social education regarding self care. - Health care follow up as needed for medical problems. - Restart home medications where appropriate.  Sanjuana KavaNwoko, Agnes I, NP, PMHNP, FNP-BC 11/23/2016, 4:11 PM  I agree to notes and plan

## 2016-11-23 NOTE — [facility] Group Notes (Signed)
[facility] LCSW Group Therapy  11/23/2016 10 - 10:45 AM  Type of Therapy:  Group Therapy  Participation Level:  Did Not Attend; invited to participate yet did not despite overhead announcement and encouragement by staff   [name], LCSW 

## 2016-11-24 ENCOUNTER — Encounter: Payer: 59 | Admitting: Hematology

## 2016-11-24 DIAGNOSIS — F1120 Opioid dependence, uncomplicated: Secondary | ICD-10-CM

## 2016-11-24 DIAGNOSIS — Z87891 Personal history of nicotine dependence: Secondary | ICD-10-CM

## 2016-11-24 DIAGNOSIS — R946 Abnormal results of thyroid function studies: Secondary | ICD-10-CM

## 2016-11-24 DIAGNOSIS — R7989 Other specified abnormal findings of blood chemistry: Secondary | ICD-10-CM | POA: Clinically undetermined

## 2016-11-24 MED ORDER — TRAZODONE HCL 50 MG PO TABS: 50.0000 mg | ORAL_TABLET | Freq: Every evening | ORAL | 0 refills | Status: AC | PRN

## 2016-11-24 MED ORDER — HYDROXYZINE HCL 25 MG PO TABS: 25.0000 mg | ORAL_TABLET | Freq: Three times a day (TID) | ORAL | 0 refills | Status: AC | PRN

## 2016-11-24 MED ORDER — NICOTINE POLACRILEX 2 MG MT GUM: 2.0000 mg | CHEWING_GUM | OROMUCOSAL | 0 refills | Status: AC | PRN

## 2016-11-24 MED ORDER — BACLOFEN 10 MG PO TABS: 5.0000 mg | ORAL_TABLET | Freq: Three times a day (TID) | ORAL | 0 refills | Status: AC

## 2016-11-24 NOTE — Progress Notes (Signed)
D: Olivia Casey denied SI, HI, AVH, and pain. She reports sleeping OK last night and said she would like to discharge today. She appears only slightly subdued but is pleasant, calm and cooperative upon approach. She denies any issues or concerns.   A: Meds given as ordered. Q15 safety checks maintained. Support/encouragement offered.  R: Pt remains free from harm and continues with treatment. Will continue to monitor for needs/safety.

## 2016-11-24 NOTE — [facility] Suicide Risk Assessment (Signed)
[facility]   Principal Problem: MDD (major depressive disorder), recurrent episode, severe (HCC) Discharge Diagnoses:  Patient Active Problem List   Diagnosis Date Noted  . Opioid use disorder, moderate, dependence (HCC) [F11.20] 11/24/2016  . Low TSH level [R94.6] 11/24/2016  . Substance abuse [F19.10] 11/21/2016  . Heroin overdose [T40.1X1A] 11/21/2016  . MDD (major depressive disorder), severe (HCC) [F32.2] 11/21/2016  . MDD (major depressive disorder), recurrent episode, severe (HCC) [F33.2] 11/21/2016    Total Time spent with patient: 30 minutes  Musculoskeletal: Strength & Muscle Tone: within normal limits Gait & Station: normal Patient leans: N/A  Psychiatric Specialty Exam: Review of Systems  Psychiatric/Behavioral: Positive for substance abuse. Negative for depression and suicidal ideas.  All other systems reviewed and are negative.   Blood pressure 97/82, pulse 100, temperature 98 F (36.7 C), temperature source Oral, resp. rate 16, height 5\' 3"  (1.6 m), weight 61.2 kg (135 lb), SpO2 99 %.Body mass index is 23.91 kg/m.  General Appearance: Casual  Eye Contact::  Fair  Speech:  Clear and Coherent409  Volume:  Normal  Mood:  Euthymic  Affect:  Appropriate  Thought Process:  Goal Directed and Descriptions of Associations: Intact  Orientation:  Full (Time, Place, and Person)  Thought Content:  Logical  Suicidal Thoughts:  No  Homicidal Thoughts:  No  Memory:  Immediate;   Fair Recent;   Fair Remote;   Fair  Judgement:  Fair  Insight:  Fair  Psychomotor Activity:  Normal  Concentration:  Fair  Recall:  [employer]  Fund of Knowledge:Fair  Language: Fair  Akathisia:  No  Handed:  Right  AIMS (if indicated):     Assets:  Communication Skills Desire for Improvement  Sleep:  Number of Hours: 6.5  Cognition: WNL  ADL's:  Intact   Mental Status Per Nursing Assessment::   On Admission:  Self-harm behaviors  Demographic Factors:   Caucasian  Loss Factors: NA  Historical Factors: Impulsivity  Risk Reduction Factors:   Positive social support and Positive therapeutic relationship  Continued Clinical Symptoms:  Alcohol/Substance Abuse/Dependencies  Cognitive Features That Contribute To Risk:  None    Suicide Risk:  Minimal: No identifiable suicidal ideation.  Patients presenting with no risk factors but with morbid ruminations; may be classified as minimal risk based on the severity of the depressive symptoms  Follow-up Information    [facility]   Why:  Appt on this date at 11:20AM for hospital follow-up/medication management/assessment for counseling services. Thank you.  Contact information: [employer] [place], [place] [address] Phone: [phone] Fax: [phone]          Plan Of Care/Follow-up recommendations:  Activity:  no restriction Diet:  regular Other:  Follow up your TSH level with your PMD  Raine Blodgett, MD 11/24/2016, 1:58 PM

## 2016-11-24 NOTE — Progress Notes (Signed)
  [facility] :  Will you be returning to the same living situation after discharge:  Yes,  home with aunt and uncle. At discharge, do you have transportation home?: Yes,  pt's aunt. Do you have the ability to pay for your medications: Yes,  [facility]  Release of information consent forms completed and  Submitted to medical records by CSW.  Follow-up Information    [facility].   Why:  Appt on this date at 11:20AM for hospital follow-up/medication management/assessment for counseling services. Thank you.  Contact information: [employer] [place], [place] [address] Phone: [phone] Fax: [phone]          Next level of care provider [facility]:no  Safety Planning and Suicide Prevention discussed: Yes,  SPE completed with pt; pt declined to consent to family contact. SPI pamphlet and Mobile Crisis information provided.  Have you used any form of tobacco in the last 30 days? (Cigarettes, Smokeless Tobacco, Cigars, and/or Pipes): No  Has patient been referred to the Quitline?: N/A patient is not a smoker  Patient has been referred for addiction treatment: Yes  Zakariyya Helfman N Smart LCSW 11/24/2016, 1:30 PM

## 2016-11-24 NOTE — Progress Notes (Signed)
Recreation Therapy Notes  Date: 11/24/16 Time: 0930 Location: 300 Hall Group Room  Group Topic: Stress Management  Goal Area(s) Addresses:  Patient will verbalize importance of using healthy stress management.  Patient will identify positive emotions associated with healthy stress management.   Behavioral Response: Engaged  Intervention: Stress Management   Activity :  Body Scan Meditation.  LRT introduced the stress management technique of meditation to patients.  LRT played a meditation from the Calm app to allow patients the opportunity to engage in the meditation.  Patients were to follow along as the meditation played.  Education:  Stress Management, Discharge Planning.   Education Outcome: Acknowledges edcuation/In group clarification offered/Needs additional education  Clinical Observations/Feedback: Pt attended group.    Camp [place], LRT/CTRS         Redell Bhandari A 11/24/2016 12:12 PM 

## 2016-11-24 NOTE — Progress Notes (Signed)
Patient was discharged per order. AVS, medications, scripts, transition summary, and suicide safety plan were all reviewed with patient. Pt was given an opportunity to ask questions and verbalized understanding of all discharge paperwork. Belongings were returned, and patient signed for receipt. Patient verbalized readiness for discharge and appeared in no acute distress when escorted to lobby.  

## 2016-11-24 NOTE — Progress Notes (Signed)
  D: Pt at the time of assessment denied any form of depression, anxiety, pain, SI, HI or AVH; states, "I think I will be leaving tomorrow; I will waiting to get the good news from the doctor tomorrow." Pt was observed interacting with peers while in the dayroom. Pt was calm and cooperative. A: Medications offered as prescribed.  Support, encouragement, and safe environment provided.  15-minute safety checks continue. R: Pt was med compliant.  Pt attended group. Safety checks continue.

## 2016-11-24 NOTE — Discharge Summary (Signed)
Physician Discharge Summary Note  Patient:  Olivia Casey is an 26 y.o., female MRN:  [id number] DOB:  [birth year] Patient phone:  [phone] (home)  Patient address:   [address] [place] [address],  Total Time spent with patient: 30 minutes  Date of Admission:  11/21/2016 Date of Discharge: 11/24/2016  Reason for Admission:  Heroin overdose  Principal Problem: MDD (major depressive disorder), recurrent episode, severe [facility]) Discharge Diagnoses: Patient Active Problem List   Diagnosis Date Noted  . Opioid use disorder, moderate, dependence (HCC) [F11.20] 11/24/2016  . Low TSH level [R94.6] 11/24/2016  . Substance abuse [F19.10] 11/21/2016  . Heroin overdose [T40.1X1A] 11/21/2016  . MDD (major depressive disorder), severe (HCC) [F32.2] 11/21/2016  . MDD (major depressive disorder), recurrent episode, severe (HCC) [F33.2] 11/21/2016    Past Psychiatric History: see HPI  Past Medical History:  Past Medical History:  Diagnosis Date  . Asthma   . Substance abuse     Past Surgical History:  Procedure Laterality Date  . ADENOIDECTOMY    . TONSILLECTOMY     Family History: History reviewed. No pertinent family history. Family Psychiatric  History: see HPI Social History:  History  Alcohol Use No     History  Drug Use    Comment: heroin    Social History   Social History  . Marital status: Single    Spouse name: N/A  . Number of children: N/A  . Years of education: N/A   Social History Main Topics  . Smoking status: Former Games developer  . Smokeless tobacco: Former Neurosurgeon  . Alcohol use No  . Drug use:      Comment: heroin  . Sexual activity: Not Asked   Other Topics Concern  . None   Social History Narrative  . None    Hospital Course:    Kenady Doxtater was admitted for MDD (major depressive disorder), recurrent episode, severe (HCC) and crisis management.  Patient was treated with medications with their indications listed below in detail under Medication  List.  Medical problems were identified and treated as needed.  Home medications were restarted as appropriate.  Improvement was monitored by observation and Makenzi Elie daily report of symptom reduction.  Emotional and mental status was monitored by daily self inventory reports completed by Ledell Noss and clinical staff.  Patient reported continued improvement, denied any new concerns.  Patient had been compliant on medications and denied side effects.  Support and encouragement was provided.    Charniece Edds was evaluated by the treatment team for stability and plans for continued recovery upon discharge.  Patient was offered further treatment options upon discharge including Residential, Intensive Outpatient and Outpatient treatment. Patient will follow up with agency listed below for medication management and counseling.  Encouraged patient to maintain satisfactory support network and home environment.  Advised to adhere to medication compliance and outpatient treatment follow up.  Prescriptions provided.       Dannah Sones motivation was an integral factor for scheduling further treatment.  Employment, transportation, bed availability, health status, family support, and any pending legal issues were also considered during patient's hospital stay.  Upon completion of this admission the patient was both mentally and medically stable for discharge denying suicidal/homicidal ideation, auditory/visual/tactile hallucinations, delusional thoughts and paranoia.      Physical Findings: AIMS: Facial and Oral Movements Muscles of Facial Expression: None, normal Lips and Perioral Area: None, normal Jaw: None, normal Tongue: None, normal,Extremity Movements Upper (arms, wrists, hands, fingers): None, normal  Lower (legs, knees, ankles, toes): None, normal, Trunk Movements Neck, shoulders, hips: None, normal, Overall Severity Severity of abnormal movements (highest score from questions above): None,  normal Incapacitation due to abnormal movements: None, normal Patient's awareness of abnormal movements (rate only patient's report): No Awareness, Dental Status Current problems with teeth and/or dentures?: No Does patient usually wear dentures?: No  CIWA:    COWS:     Musculoskeletal: Strength & Muscle Tone: within normal limits Gait & Station: normal Patient leans: N/A  Psychiatric Specialty Exam:  See MD SRA Physical Exam  Nursing note and vitals reviewed. Psychiatric: She has a normal mood and affect. Her speech is normal and behavior is normal. Judgment and thought content normal. Cognition and memory are normal.    Review of Systems  Constitutional: Negative.   HENT: Negative.   Eyes: Negative.   Respiratory: Negative.   Cardiovascular: Negative.   Gastrointestinal: Negative.   Genitourinary: Negative.   Musculoskeletal: Negative.   Skin: Negative.   Neurological: Negative.   Endo/Heme/Allergies: Negative.   Psychiatric/Behavioral: Negative.     Blood pressure 97/82, pulse 100, temperature 98 F (36.7 C), temperature source Oral, resp. rate 16, height 5\' 3"  (1.6 m), weight 61.2 kg (135 lb), SpO2 99 %.Body mass index is 23.91 kg/m.   Have you used any form of tobacco in the last 30 days? (Cigarettes, Smokeless Tobacco, Cigars, and/or Pipes): No  Has this patient used any form of tobacco in the last 30 days? (Cigarettes, Smokeless Tobacco, Cigars, and/or Pipes) Yes, Rx given to patient   Blood Alcohol level:  [facility] results found for: [facility]  Metabolic Disorder Labs:  Lab Results  Component Value Date   HGBA1C 5.0 11/22/2016   MPG 97 11/22/2016   No results found for: PROLACTIN Lab Results  Component Value Date   CHOL 148 11/22/2016   TRIG 129 11/22/2016   HDL 39 (L) 11/22/2016   CHOLHDL 3.8 11/22/2016   VLDL 26 11/22/2016   LDLCALC 83 11/22/2016    See Psychiatric Specialty Exam and Suicide Risk Assessment completed by Attending Physician prior to  discharge.  Discharge destination:  Home  Is patient on multiple antipsychotic therapies at discharge:  No   Has Patient had three or more failed trials of antipsychotic monotherapy by history:  No  Recommended Plan for Multiple Antipsychotic Therapies: NA   Allergies as of 11/24/2016      Reactions   Clindamycin/lincomycin Anaphylaxis   Peanut-containing Drug Products Anaphylaxis   Penicillins Anaphylaxis   Has patient had a PCN reaction causing immediate rash, facial/tongue/throat swelling, SOB or lightheadedness with hypotension:unknown Has patient had a PCN reaction causing severe rash involving mucus membranes or skin necrosis: yes Has patient had a PCN reaction that required hospitalization: no Has patient had a PCN reaction occurring within the last 10 years: yes If all of the above answers are "NO", then may proceed with Cephalosporin use   Amoxicillin-pot Clavulanate       Medication List    STOP taking these medications   doxycycline 100 MG capsule Commonly known as:  VIBRAMYCIN   ibuprofen 800 MG tablet Commonly known as:  ADVIL,MOTRIN   levonorgestrel 20 MCG/24HR IUD Commonly known as:  MIRENA   MUCINEX FAST-MAX COLD FLU 5-10-200-325 MG/10ML Liqd Generic drug:  Phenylephrine-DM-GG-APAP   THERAFLU COLD/COUGH DAYTIME PO     TAKE these medications     Indication  baclofen 10 MG tablet Commonly known as:  LIORESAL Take 0.5 tablets (5 mg total) by mouth 3 (  three) times daily.  Indication:  Muscle Spasticity   hydrOXYzine 25 MG tablet Commonly known as:  ATARAX/VISTARIL Take 1 tablet (25 mg total) by mouth 3 (three) times daily as needed for anxiety.  Indication:  Anxiety Neurosis   nicotine polacrilex 2 MG gum Commonly known as:  NICORETTE Take 1 each (2 mg total) by mouth as needed for smoking cessation.  Indication:  Nicotine Addiction   traZODone 50 MG tablet Commonly known as:  DESYREL Take 1 tablet (50 mg total) by mouth at bedtime as needed for  sleep.  Indication:  Trouble Sleeping      Follow-up Information    [facility] Follow up on 12/05/2016.   Why:  Appt on this date at 11:20AM for hospital follow-up/medication management/assessment for counseling services. Thank you.  Contact information: [employer] [place], [place] [address] Phone: [phone] Fax: [phone]          Follow-up recommendations:  Activity:  as tol Diet:  as tol  Comments:  1.  Take all your medications as prescribed.   2.  Report any adverse side effects to outpatient provider. 3.  Patient instructed to not use alcohol or illegal drugs while on prescription medicines. 4.  In the event of worsening symptoms, instructed patient to call 911, the crisis hotline or go to nearest emergency room for evaluation of symptoms.  Signed: Lindwood Qua, NP [facility] 11/24/2016, 3:06 PM

## 2016-11-24 NOTE — [facility] Suicide Risk Assessment (Signed)
[facility] INPATIENT:  Family/Significant Other Suicide Prevention Education  Suicide Prevention Education:  Patient Refusal for Family/Significant Other Suicide Prevention Education: The patient Olivia Casey has refused to provide written consent for family/significant other to be provided Family/Significant Other Suicide Prevention Education during admission and/or prior to discharge.  Physician notified.  SPE completed with pt, as pt refused to consent to family contact. SPI pamphlet provided to pt and pt was encouraged to share information with support network, ask questions, and talk about any concerns relating to SPE. Pt denies access to guns/firearms and verbalized understanding of information provided. Mobile Crisis information also provided to pt.   Katlin Bortner N Smart LCSW 11/24/2016, 1:23 PM

## 2016-11-24 NOTE — Tx Team (Signed)
Interdisciplinary Treatment and Diagnostic Plan Update  11/24/2016 Time of Session: 9:30AM Olivia Casey MRN: [id number]  Principal Diagnosis: Opioid use disorder, severe, dependence Secondary Diagnoses: Active Problems:   MDD (major depressive disorder), recurrent episode, severe (HCC)   Opioid dependence with uncomplicated intoxication ([place])   Current Medications:  Current Facility-Administered Medications  Medication Dose Route Frequency Provider Last Rate Last Dose  . acetaminophen (TYLENOL) tablet 650 mg  650 mg Oral Q6H PRN Nanci Pina, FNP      . alum & mag hydroxide-simeth (MAALOX/MYLANTA) 200-200-20 MG/5ML suspension 30 mL  30 mL Oral Q4H PRN Nanci Pina, FNP      . baclofen (LIORESAL) tablet 5 mg  5 mg Oral TID Nanci Pina, FNP   5 mg at 11/24/16 1259  . hydrOXYzine (ATARAX/VISTARIL) tablet 25 mg  25 mg Oral TID PRN Nanci Pina, FNP   25 mg at 11/23/16 2150  . magnesium hydroxide (MILK OF MAGNESIA) suspension 30 mL  30 mL Oral Daily PRN Nanci Pina, FNP      . nicotine polacrilex (NICORETTE) gum 2 mg  2 mg Oral PRN Jenne Campus, MD   2 mg at 11/24/16 1259  . traZODone (DESYREL) tablet 50 mg  50 mg Oral QHS PRN Nanci Pina, FNP   50 mg at 11/23/16 2150   PTA Medications: Prescriptions Prior to Admission  Medication Sig Dispense Refill Last Dose  . baclofen (LIORESAL) 10 MG tablet Take 0.5 tablets (5 mg total) by mouth 3 (three) times daily. (Patient not taking: Reported on 11/21/2016) 20 each 0 Completed Course at Unknown time  . doxycycline (VIBRAMYCIN) 100 MG capsule Take 100 mg by mouth 2 (two) times daily.   Past Week at Unknown time  . ibuprofen (ADVIL,MOTRIN) 800 MG tablet Take 800 mg by mouth every 8 (eight) hours as needed for headache, mild pain or moderate pain.    Past Week at Unknown time  . levonorgestrel (MIRENA) 20 MCG/24HR IUD 1 each by Intrauterine route once.     Marland Kitchen Phenylephrine-DM (THERAFLU COLD/COUGH DAYTIME PO) Take 20 mLs by mouth as  needed (cold).   Past Week at Unknown time  . Phenylephrine-DM-GG-APAP (MUCINEX FAST-MAX COLD FLU) 5-10-200-325 MG/10ML LIQD Take 20 mLs by mouth every 6 (six) hours as needed (cold).   Past Week at Unknown time    Patient Stressors: Substance abuse  Patient Strengths: Ability for insight Average or above average intelligence Communication skills Motivation for treatment/growth Physical Health Supportive family/friends  Treatment Modalities: Medication Management, Group therapy, Case management,  1 to 1 session with clinician, Psychoeducation, Recreational therapy.   Physician Treatment Plan for Primary Diagnosis: Opioid use disorder, severe, dependence Long Term Goal(s): Improvement in symptoms so as ready for discharge Improvement in symptoms so as ready for discharge   Short Term Goals: Ability to identify changes in lifestyle to reduce recurrence of condition will improve Ability to verbalize feelings will improve Ability to demonstrate self-control will improve Ability to identify and develop effective coping behaviors will improve Ability to identify changes in lifestyle to reduce recurrence of condition will improve Ability to demonstrate self-control will improve Ability to identify and develop effective coping behaviors will improve Compliance with prescribed medications will improve Ability to identify triggers associated with substance abuse/mental health issues will improve  Medication Management: Evaluate patient's response, side effects, and tolerance of medication regimen.  Therapeutic Interventions: 1 to 1 sessions, Unit Group sessions and Medication administration.  Evaluation of Outcomes: Met  Physician  Treatment Plan for Secondary Diagnosis: Active Problems:   MDD (major depressive disorder), recurrent episode, severe (HCC)   Opioid dependence with uncomplicated intoxication ([place])  Long Term Goal(s): Improvement in symptoms so as ready for  discharge Improvement in symptoms so as ready for discharge   Short Term Goals: Ability to identify changes in lifestyle to reduce recurrence of condition will improve Ability to verbalize feelings will improve Ability to demonstrate self-control will improve Ability to identify and develop effective coping behaviors will improve Ability to identify changes in lifestyle to reduce recurrence of condition will improve Ability to demonstrate self-control will improve Ability to identify and develop effective coping behaviors will improve Compliance with prescribed medications will improve Ability to identify triggers associated with substance abuse/mental health issues will improve     Medication Management: Evaluate patient's response, side effects, and tolerance of medication regimen.  Therapeutic Interventions: 1 to 1 sessions, Unit Group sessions and Medication administration.  Evaluation of Outcomes: Met   RN Treatment Plan for Primary Diagnosis: Opioid use disorder, severe, dependence Long Term Goal(s): Knowledge of disease and therapeutic regimen to maintain health will improve  Short Term Goals: Ability to remain free from injury will improve, Ability to disclose and discuss suicidal ideas and Ability to identify and develop effective coping behaviors will improve  Medication Management: RN will administer medications as ordered by provider, will assess and evaluate patient's response and provide education to patient for prescribed medication. RN will report any adverse and/or side effects to prescribing provider.  Therapeutic Interventions: 1 on 1 counseling sessions, Psychoeducation, Medication administration, Evaluate responses to treatment, Monitor vital signs and CBGs as ordered, Perform/monitor CIWA, COWS, AIMS and Fall Risk screenings as ordered, Perform wound care treatments as ordered.  Evaluation of Outcomes: Met   LCSW Treatment Plan for Primary Diagnosis: Opioid use  disorder, severe, dependence Long Term Goal(s): Safe transition to appropriate next level of care at discharge, Engage patient in therapeutic group addressing interpersonal concerns.  Short Term Goals: Engage patient in aftercare planning with referrals and resources, Facilitate patient progression through stages of change regarding substance use diagnoses and concerns and Identify triggers associated with mental health/substance abuse issues  Therapeutic Interventions: Assess for all discharge needs, 1 to 1 time with Social worker, Explore available resources and support systems, Assess for adequacy in community support network, Educate family and significant other(s) on suicide prevention, Complete Psychosocial Assessment, Interpersonal group therapy.  Evaluation of Outcomes: Met   Progress in Treatment: Attending groups: Yes. Participating in groups: Yes. Taking medication as prescribed: Yes. Toleration medication: Yes. Family/Significant other contact made: SPE completed with pt; pt declined to consent to family contact. Patient understands diagnosis: Yes. Discussing patient identified problems/goals with staff: Yes. Medical problems stabilized or resolved: Yes. Denies suicidal/homicidal ideation: Yes. Issues/concerns per patient self-inventory: No. Other: n/a  New problem(s) identified: No, Describe:  n/a  New Short Term/Long Term Goal(s): detox; medication stabilization; development of comprehensive mental wellness/sobriety plan.   Discharge Plan or Barriers: Pt plans to return home; follow[facility]up at [facility] for mental health outpatient services.  Reason for Continuation of Hospitalization: none  Estimated Length of Stay: d/c today  Attendees: Patient: 11/24/2016 1:24 PM  Physician: Dr. Shea Evans MD 11/24/2016 1:24 PM  Nursing: Vallery Ridge; Santiago Glad RN 11/24/2016 1:24 PM  RN Care Manager: Lars Pinks CM 11/24/2016 1:24 PM  Social Worker: Nira Conn Smart, LCSW; Adriana Reams LCSW  11/24/2016 1:24 PM  Recreational Therapist:  11/24/2016 1:24 PM  Other: Ricky Ala NP; May  Augustin NP 11/24/2016 1:24 PM  Other:  11/24/2016 1:24 PM  Other: 11/24/2016 1:24 PM    Scribe for Treatment Team: [place], LCSW 11/24/2016 1:24 PM

## 2016-11-27 ENCOUNTER — Emergency Department (HOSPITAL_BASED_OUTPATIENT_CLINIC_OR_DEPARTMENT_OTHER)
Admit: 2016-11-27 | Discharge: 2016-11-27 | Disposition: A | Discharge status: Home / Self Care | Payer: 59 | Attending: Emergency Medicine | Admitting: Emergency Medicine

## 2016-11-27 ENCOUNTER — Emergency Department (HOSPITAL_COMMUNITY): Payer: 59

## 2016-11-27 ENCOUNTER — Emergency Department (HOSPITAL_COMMUNITY)
Admission: EM | Admit: 2016-11-27 | Discharge: 2016-11-27 | Disposition: A | Discharge status: Home / Self Care | Payer: 59 | Attending: Emergency Medicine | Admitting: Emergency Medicine

## 2016-11-27 ENCOUNTER — Encounter (HOSPITAL_COMMUNITY): Payer: Self-pay

## 2016-11-27 DIAGNOSIS — T401X1D Poisoning by heroin, accidental (unintentional), subsequent encounter: Secondary | ICD-10-CM | POA: Diagnosis not present

## 2016-11-27 DIAGNOSIS — J45909 Unspecified asthma, uncomplicated: Secondary | ICD-10-CM | POA: Diagnosis not present

## 2016-11-27 DIAGNOSIS — M79662 Pain in left lower leg: Secondary | ICD-10-CM | POA: Diagnosis not present

## 2016-11-27 DIAGNOSIS — Z79899 Other long term (current) drug therapy: Secondary | ICD-10-CM | POA: Insufficient documentation

## 2016-11-27 DIAGNOSIS — M79609 Pain in unspecified limb: Secondary | ICD-10-CM

## 2016-11-27 DIAGNOSIS — M7989 Other specified soft tissue disorders: Secondary | ICD-10-CM

## 2016-11-27 DIAGNOSIS — Z87891 Personal history of nicotine dependence: Secondary | ICD-10-CM | POA: Diagnosis not present

## 2016-11-27 DIAGNOSIS — Z91010 Allergy to peanuts: Secondary | ICD-10-CM | POA: Insufficient documentation

## 2016-11-27 LAB — COMPREHENSIVE METABOLIC PANEL
ALT: 55 U/L — ABNORMAL HIGH (ref 14–54)
AST: 69 U/L — AB (ref 15–41)
Albumin: 4 g/dL (ref 3.5–5.0)
Alkaline Phosphatase: 58 U/L (ref 38–126)
Anion gap: 11 (ref 5–15)
BUN: 11 mg/dL (ref 6–20)
CHLORIDE: 98 mmol/L — AB (ref 101–111)
CO2: 25 mmol/L (ref 22–32)
Calcium: 9 mg/dL (ref 8.9–10.3)
Creatinine, Ser: 0.9 mg/dL (ref 0.44–1.00)
Glucose, Bld: 141 mg/dL — ABNORMAL HIGH (ref 65–99)
POTASSIUM: 3.6 mmol/L (ref 3.5–5.1)
SODIUM: 134 mmol/L — AB (ref 135–145)
Total Bilirubin: 0.8 mg/dL (ref 0.3–1.2)
Total Protein: 6.9 g/dL (ref 6.5–8.1)

## 2016-11-27 LAB — CBC
HCT: 37.6 % (ref 36.0–46.0)
Hemoglobin: 12.5 g/dL (ref 12.0–15.0)
MCH: 30.5 pg (ref 26.0–34.0)
MCHC: 33.2 g/dL (ref 30.0–36.0)
MCV: 91.7 fL (ref 78.0–100.0)
PLATELETS: 453 10*3/uL — AB (ref 150–400)
RBC: 4.1 MIL/uL (ref 3.87–5.11)
RDW: 11.6 % (ref 11.5–15.5)
WBC: 39.9 10*3/uL — ABNORMAL HIGH (ref 4.0–10.5)

## 2016-11-27 LAB — RAPID URINE DRUG SCREEN, HOSP PERFORMED
AMPHETAMINES: NOT DETECTED
BENZODIAZEPINES: NOT DETECTED
Barbiturates: NOT DETECTED
Cocaine: NOT DETECTED
Opiates: POSITIVE — AB
Tetrahydrocannabinol: NOT DETECTED

## 2016-11-27 LAB — ETHANOL

## 2016-11-27 LAB — PREGNANCY, URINE: Preg Test, Ur: NEGATIVE

## 2016-11-27 LAB — ACETAMINOPHEN LEVEL: Acetaminophen (Tylenol), Serum: <10 ug/mL — ABNORMAL LOW (ref 10–30)

## 2016-11-27 LAB — SALICYLATE LEVEL

## 2016-11-27 NOTE — Discharge Instructions (Signed)
Please use the resources attached to seek help for your addiction. You can take ibuprofen or Tylenol as prescribed over-the-counter for your calf pain. I also recommend stretching you calf 3-4 times daily for 30 seconds. Please return to the emergency department if you develop any new or worsening symptoms.

## 2016-11-27 NOTE — ED Triage Notes (Signed)
Pt brought in by [employer] for overdose on heroin, according to [employer] this is pts third overdose in two weeks, pt received narcan at 3am. Pt requesting help to get clean, denies SI/Hi/AVH

## 2016-11-27 NOTE — Progress Notes (Signed)
VASCULAR LAB PRELIMINARY  PRELIMINARY  PRELIMINARY  PRELIMINARY  Left lower extremity venous duplex completed.    Preliminary report:  There is no DVT or SVT noted in the left lower extremity.    [place], [place]  [facility], Pearson Reasons, RVT 11/27/2016, 8:44 AM

## 2016-11-27 NOTE — ED Provider Notes (Signed)
[facility]-EMERGENCY DEPT Provider Note   CSN: [id number] Arrival date & time: 11/27/16  0422     History   Chief Complaint Chief Complaint  Patient presents with  . Drug Overdose    HPI Olivia Casey is a 26 y.o. female with history of asthma and substance abuse who presents following her when overdose. Patient reports using heroin requiring Narcan early this morning. Patient reports she has been clean for the past 8-9 months, but has began using over the past 1-2 weeks. Patient was recently discharged from behavioral health with the same problem. Patient reports she has been more stressed due to the holidays. Patient has also been sick with cold symptoms with cough. She is taking antibiotics finishing them soon. Patient also reports left calf pain that she describes as a cramp. Patient reports she was walking around a building on it for work yesterday in high heels. She believes it is just a strain. Patient denies any recent long trips in a car or airplane, recent surgeries, cancer. Patient has a Mirena IUD. Patient denies any chest pain, shortness of breath, abdominal pain, nausea, vomiting, urinary symptoms.   Drug Overdose  Pertinent negatives include no chest pain, no abdominal pain, no headaches and no shortness of breath.    Past Medical History:  Diagnosis Date  . Asthma   . Substance abuse     Patient Active Problem List   Diagnosis Date Noted  . Opioid use disorder, moderate, dependence (HCC) 11/24/2016  . Low TSH level 11/24/2016  . Substance abuse 11/21/2016  . Heroin overdose 11/21/2016  . MDD (major depressive disorder), severe (HCC) 11/21/2016  . MDD (major depressive disorder), recurrent episode, severe (HCC) 11/21/2016    Past Surgical History:  Procedure Laterality Date  . ADENOIDECTOMY    . TONSILLECTOMY      OB History    No data available       Home Medications    Prior to Admission medications   Medication Sig Start Date End Date Taking?  Authorizing Provider  baclofen (LIORESAL) 10 MG tablet Take 0.5 tablets (5 mg total) by mouth 3 (three) times daily. 11/24/16   Adonis BrookSheila Agustin, NP  hydrOXYzine (ATARAX/VISTARIL) 25 MG tablet Take 1 tablet (25 mg total) by mouth 3 (three) times daily as needed for anxiety. 11/24/16   Adonis BrookSheila Agustin, NP  nicotine polacrilex (NICORETTE) 2 MG gum Take 1 each (2 mg total) by mouth as needed for smoking cessation. 11/24/16   Adonis BrookSheila Agustin, NP  traZODone (DESYREL) 50 MG tablet Take 1 tablet (50 mg total) by mouth at bedtime as needed for sleep. 11/24/16   Adonis BrookSheila Agustin, NP    Family History No family history on file.  Social History Social History  Substance Use Topics  . Smoking status: Former Games developermoker  . Smokeless tobacco: Former NeurosurgeonUser  . Alcohol use No     Allergies   Clindamycin/lincomycin; Peanut-containing drug products; Penicillins; and Amoxicillin-pot clavulanate   Review of Systems Review of Systems  Constitutional: Negative for chills and fever.  HENT: Negative for facial swelling and sore throat.   Respiratory: Positive for cough. Negative for shortness of breath.   Cardiovascular: Negative for chest pain.  Gastrointestinal: Negative for abdominal pain, nausea and vomiting.  Genitourinary: Negative for dysuria.  Musculoskeletal: Positive for myalgias. Negative for back pain.  Skin: Negative for rash and wound.  Neurological: Negative for headaches.  Psychiatric/Behavioral: The patient is not nervous/anxious.      Physical Exam Updated Vital Signs BP 106/57  Pulse 90   Temp 99 F (37.2 C) (Oral)   Resp 18   Ht 5\' 3"  (1.6 m)   Wt 61.2 kg   SpO2 99%   BMI 23.91 kg/m   Physical Exam  Constitutional: She appears well-developed and well-nourished. No distress.  HENT:  Head: Normocephalic and atraumatic.  Mouth/Throat: Oropharynx is clear and moist. No oropharyngeal exudate.  Eyes: Conjunctivae are normal. Pupils are equal, round, and reactive to light. Right eye exhibits  no discharge. Left eye exhibits no discharge. No scleral icterus.  Neck: Normal range of motion. Neck supple. No thyromegaly present.  Cardiovascular: Normal rate, regular rhythm, normal heart sounds and intact distal pulses.  Exam reveals no gallop and no friction rub.   No murmur heard. Pulmonary/Chest: Effort normal and breath sounds normal. No stridor. No respiratory distress. She has no wheezes. She has no rales.  Abdominal: Soft. Bowel sounds are normal. She exhibits no distension. There is no tenderness. There is no rebound and no guarding.  Musculoskeletal: She exhibits no edema.  Left calf TTP, no color change  Lymphadenopathy:    She has no cervical adenopathy.  Neurological: She is alert. Coordination normal.  Skin: Skin is warm and dry. No rash noted. She is not diaphoretic. No pallor.  Psychiatric: She has a normal mood and affect.  Nursing note and vitals reviewed.    ED Treatments / Results  Labs (all labs ordered are listed, but only abnormal results are displayed) Labs Reviewed  COMPREHENSIVE METABOLIC PANEL - Abnormal; Notable for the following:       Result Value   Sodium 134 (*)    Chloride 98 (*)    Glucose, Bld 141 (*)    AST 69 (*)    ALT 55 (*)    All other components within normal limits  ACETAMINOPHEN LEVEL - Abnormal; Notable for the following:    Acetaminophen (Tylenol), Serum <10 (*)    All other components within normal limits  CBC - Abnormal; Notable for the following:    WBC 39.9 (*)    Platelets 453 (*)    All other components within normal limits  RAPID URINE DRUG SCREEN, HOSP PERFORMED - Abnormal; Notable for the following:    Opiates POSITIVE (*)    All other components within normal limits  CULTURE, BLOOD (ROUTINE X 2)  CULTURE, BLOOD (ROUTINE X 2)  ETHANOL  SALICYLATE LEVEL  PREGNANCY, URINE  CBG MONITORING, ED    EKG  EKG Interpretation  Date/Time:  Thursday November 27 2016 04:43:50 EST Ventricular Rate:  86 PR  Interval:  132 QRS Duration: 92 QT Interval:  374 QTC Calculation: 447 R Axis:   73 Text Interpretation:  Normal sinus rhythm Nonspecific T wave abnormality Abnormal ECG no significant change since Nov 21 2016 Confirmed by Criss Alvine MD, SCOTT [phone]) on 11/27/2016 6:57:43 AM       Radiology Dg Chest 2 View  Result Date: 11/27/2016 CLINICAL DATA:  26 year old female with cough for 1 month. Initial encounter. EXAM: CHEST  2 VIEW COMPARISON:  11/18/2016. FINDINGS: Larger lung volumes, normal today. Normal cardiac size and mediastinal contours. Visualized tracheal air column is within normal limits. No pneumothorax, pulmonary edema, pleural effusion or consolidation. Suggestion of mild central peribronchial thickening, most apparent about the left hilum. Otherwise no confluent pulmonary opacity. No osseous abnormality identified. Negative visible bowel gas pattern. IMPRESSION: Possible central peribronchial thickening such as due to viral or reactive airway disease, but otherwise negative chest. Electronically Signed   By:  Odessa Fleming M.D.   On: 11/27/2016 07:42    Procedures Procedures (including critical care time)  Medications Ordered in ED Medications - No data to display   Initial Impression / Assessment and Plan / ED Course  I have reviewed the triage vital signs and the nursing notes.  Pertinent labs & imaging results that were available during my care of the patient were reviewed by me and considered in my medical decision making (see chart for details).  Clinical Course     Patient with stable vitals and symptoms throughout ED course. CBC shows WBC 39.9 which is decreased from the past. Patient has follow-up with hematology for this. CMP shows sodium 134, chloride 98, glucose 141, AST 69, ALT 55. Negative ethanol, salicylate, acetaminophen levels. UDS shows positive opiates. Blood cultures sent. CXR shows possible central peribronchial thickening such as due to viral or reactive airway  disease, but otherwise negative chest. EKG shows NSR, nonspecific T-wave abnormalities, and no change since 11/21/16. DVT study shows no DVT to left calf. Most likely muscle strain. Discussed stretching and over-the-counter ibuprofen and/or Tylenol. Return precautions discussed. Patient encouraged to follow-up with outpatient resources for counseling regarding substance abuse. Patient understands and agrees with plan. Patient vitals stable throughout ED course and discharged in satisfactory condition. Patient also evaluated by Dr. Criss Alvine who guided the patient's management and agrees with plan.  Final Clinical Impressions(s) / ED Diagnoses   Final diagnoses:  Accidental overdose of heroin, subsequent encounter  Pain of left calf    New Prescriptions New Prescriptions   No medications on file     Emi Holes, PA-C 11/27/16 1610    Pricilla Loveless, MD 11/27/16 [phone]

## 2016-12-02 ENCOUNTER — Telehealth: Payer: Self-pay | Admitting: *Deleted

## 2016-12-02 LAB — CULTURE, BLOOD (ROUTINE X 2)
Culture: NO GROWTH
Culture: NO GROWTH

## 2016-12-02 NOTE — Telephone Encounter (Signed)
LVM for pt's aunt, Melton KrebsKathyTate, to please confirm pt's appt for tomorrow.  Please call nurse back to confirm.

## 2016-12-02 NOTE — Telephone Encounter (Signed)
Pt's aunt KathyTate left VM and confirmed pt's appt for tomorrow morning.

## 2016-12-03 ENCOUNTER — Ambulatory Visit (HOSPITAL_COMMUNITY)
Admission: RE | Admit: 2016-12-03 | Discharge: 2016-12-03 | Disposition: A | Discharge status: Home / Self Care | Payer: 59 | Source: Ambulatory Visit | Attending: Hematology and Oncology | Admitting: Hematology and Oncology

## 2016-12-03 ENCOUNTER — Encounter: Payer: Self-pay | Admitting: Hematology and Oncology

## 2016-12-03 ENCOUNTER — Ambulatory Visit (HOSPITAL_BASED_OUTPATIENT_CLINIC_OR_DEPARTMENT_OTHER): Payer: 59

## 2016-12-03 ENCOUNTER — Ambulatory Visit (HOSPITAL_BASED_OUTPATIENT_CLINIC_OR_DEPARTMENT_OTHER): Payer: 59 | Admitting: Hematology and Oncology

## 2016-12-03 ENCOUNTER — Telehealth: Payer: Self-pay | Admitting: Hematology and Oncology

## 2016-12-03 VITALS — BP 118/67 | HR 102 | Temp 98.5°F | Resp 18 | Ht 63.0 in | Wt 131.1 lb

## 2016-12-03 DIAGNOSIS — D72829 Elevated white blood cell count, unspecified: Secondary | ICD-10-CM | POA: Insufficient documentation

## 2016-12-03 DIAGNOSIS — F1120 Opioid dependence, uncomplicated: Secondary | ICD-10-CM

## 2016-12-03 DIAGNOSIS — R946 Abnormal results of thyroid function studies: Secondary | ICD-10-CM

## 2016-12-03 DIAGNOSIS — R05 Cough: Secondary | ICD-10-CM | POA: Diagnosis not present

## 2016-12-03 DIAGNOSIS — R7989 Other specified abnormal findings of blood chemistry: Secondary | ICD-10-CM

## 2016-12-03 DIAGNOSIS — R059 Cough, unspecified: Secondary | ICD-10-CM | POA: Insufficient documentation

## 2016-12-03 DIAGNOSIS — R748 Abnormal levels of other serum enzymes: Secondary | ICD-10-CM

## 2016-12-03 LAB — COMPREHENSIVE METABOLIC PANEL
ALT: 22 U/L (ref 0–55)
ANION GAP: 10 meq/L (ref 3–11)
AST: 19 U/L (ref 5–34)
Albumin: 4.4 g/dL (ref 3.5–5.0)
Alkaline Phosphatase: 69 U/L (ref 40–150)
BILIRUBIN TOTAL: 0.53 mg/dL (ref 0.20–1.20)
BUN: 9 mg/dL (ref 7.0–26.0)
CALCIUM: 10.1 mg/dL (ref 8.4–10.4)
CO2: 25 mEq/L (ref 22–29)
CREATININE: 0.8 mg/dL (ref 0.6–1.1)
Chloride: 103 mEq/L (ref 98–109)
EGFR: >90 mL/min/{1.73_m2} (ref >90)
Glucose: 78 mg/dl (ref 70–140)
Potassium: 4.4 mEq/L (ref 3.5–5.1)
Sodium: 138 mEq/L (ref 136–145)
TOTAL PROTEIN: 8 g/dL (ref 6.4–8.3)

## 2016-12-03 LAB — CBC WITH DIFFERENTIAL/PLATELET
BASO%: 0.4 % (ref 0.0–2.0)
Basophils Absolute: 0 10*3/uL (ref 0.0–0.1)
EOS%: 2.2 % (ref 0.0–7.0)
Eosinophils Absolute: 0.1 10*3/uL (ref 0.0–0.5)
HEMATOCRIT: 42 % (ref 34.8–46.6)
HEMOGLOBIN: 14.2 g/dL (ref 11.6–15.9)
LYMPH#: 2.5 10*3/uL (ref 0.9–3.3)
LYMPH%: 36.3 % (ref 14.0–49.7)
MCH: 31.1 pg (ref 25.1–34.0)
MCHC: 33.7 g/dL (ref 31.5–36.0)
MCV: 92.2 fL (ref 79.5–101.0)
MONO#: 0.6 10*3/uL (ref 0.1–0.9)
MONO%: 8.1 % (ref 0.0–14.0)
NEUT%: 53 % (ref 38.4–76.8)
NEUTROS ABS: 3.7 10*3/uL (ref 1.5–6.5)
PLATELETS: 330 10*3/uL (ref 145–400)
RBC: 4.56 10*6/uL (ref 3.70–5.45)
RDW: 12.2 % (ref 11.2–14.5)
WBC: 6.9 10*3/uL (ref 3.9–10.3)

## 2016-12-03 LAB — TSH: TSH: 0.659 m[IU]/L (ref 0.308–3.960)

## 2016-12-03 LAB — URINALYSIS, MICROSCOPIC - CHCC
BLOOD: NEGATIVE
Bilirubin (Urine): NEGATIVE
GLUCOSE UR CHCC: NEGATIVE mg/dL
Ketones: NEGATIVE mg/dL
LEUKOCYTE ESTERASE: NEGATIVE
Nitrite: NEGATIVE
PROTEIN: NEGATIVE mg/dL
SPECIFIC GRAVITY, URINE: 1.005 (ref 1.003–1.035)
UROBILINOGEN UR: 0.2 mg/dL (ref 0.2–1)
pH: 7 (ref 4.6–8.0)

## 2016-12-03 LAB — CHCC SMEAR

## 2016-12-03 NOTE — Assessment & Plan Note (Signed)
Cause unknown According to her, recent hepatitis screen was negative Will recheck LFT

## 2016-12-03 NOTE — Assessment & Plan Note (Signed)
She had recent abnormal TSH level I plan to repeat full thyroid function test today

## 2016-12-03 NOTE — Assessment & Plan Note (Signed)
I have obtained a signed consent to release [facility] to us [facility] year Diffierential diagnosis included possible reactive leukocytosis from infection, recent drug overdose or bone marrow malignancy With her history of IV drug use, I would be concerned for possible infective endocarditis or other localizing infection such as abscess I will draw simple blood work today and will review outside records prior to calling the patient with results Depending on the test results, that would determine the next plan of care

## 2016-12-03 NOTE — Assessment & Plan Note (Signed)
She had multiple recent drug overdoses from heroin use I spoke with the patient and tried to determine whether she may have coexisting mental health issues such as depression The patient denies depression She is aware of the danger of repeated drug overdose She states her recent drug overdose was precipitated by stress She stated she has friends and family members that she could talk to and could help her in other ways to cope rather than resorting to drug use again She appears to have clear insight of her illness

## 2016-12-03 NOTE — Progress Notes (Addendum)
[facility]inger CONSULT NOTE  Patient Care Team: Pcp Not In System as PCP - General  CHIEF COMPLAINTS/PURPOSE OF CONSULTATION:  Significant leukocytosis and thrombocytosis  HISTORY OF PRESENTING ILLNESS:  Olivia Casey 26 y.o. female is here because of elevated WBC and abnormal platelet count  She was found to have abnormal CBC from recent ER visits due to drug overdoses. She had 3 separate incident of drug overdose recently The patient has been abusing drugs for quite some time She also have history of cigarette use and vapor inhalation, currently not dependent She had prior IV drug use, last used in March 2017 She stated she had used clean needles in the past She had seen a physician in [place] and had negative hepatitis and HIV screening tests With the most recent drug use, she smoked/inhaled heroin When she was brought into the emergency department, she was noted to have abnormal blood work and urinalysis. Her most recent chest x-ray shows some bronchial thickening She has been having some productive cough with greenish sputum recently Since then, her cough has improved and now is just dry cough There is not reported symptoms of sinus congestion, urinary frequency/urgency or dysuria, diarrhea, joint swelling/pain or abnormal skin rash.  She had no prior history or diagnosis of cancer. Her age appropriate screening programs are up-to-date. The patient has no prior diagnosis of autoimmune disease and was not prescribed corticosteroids related products.  MEDICAL HISTORY:  Past Medical History:  Diagnosis Date  . Asthma   . Substance abuse     SURGICAL HISTORY: Past Surgical History:  Procedure Laterality Date  . ADENOIDECTOMY    . TONSILLECTOMY    . WISDOM TOOTH EXTRACTION      SOCIAL HISTORY: Social History   Social History  . Marital status: Single    Spouse name: N/A  . Number of children: N/A  . Years of education: N/A   Occupational History  .  internal audit    Social History Main Topics  . Smoking status: Former Research scientist (life sciences)  . Smokeless tobacco: Former Systems developer  . Alcohol use No  . Drug use:     Types: IV     Comment: heroin  . Sexual activity: Not on file   Other Topics Concern  . Not on file   Social History Narrative  . No narrative on file    FAMILY HISTORY: History reviewed. No pertinent family history.  ALLERGIES:  is allergic to clindamycin/lincomycin; peanut-containing drug products; penicillins; and amoxicillin-pot clavulanate.  MEDICATIONS:  Current Outpatient Prescriptions  Medication Sig Dispense Refill  . hydrOXYzine (ATARAX/VISTARIL) 25 MG tablet Take 1 tablet (25 mg total) by mouth 3 (three) times daily as needed for anxiety. 30 tablet 0  . traZODone (DESYREL) 50 MG tablet Take 1 tablet (50 mg total) by mouth at bedtime as needed for sleep. 30 tablet 0  . baclofen (LIORESAL) 10 MG tablet Take 0.5 tablets (5 mg total) by mouth 3 (three) times daily. (Patient not taking: Reported on 12/03/2016) 90 each 0  . nicotine polacrilex (NICORETTE) 2 MG gum Take 1 each (2 mg total) by mouth as needed for smoking cessation. (Patient not taking: Reported on 12/03/2016) 100 tablet 0   No current facility-administered medications for this visit.     REVIEW OF SYSTEMS:   Constitutional: Denies fevers, chills or abnormal night sweats Eyes: Denies blurriness of vision, double vision or watery eyes Ears, nose, mouth, throat, and face: Denies mucositis or sore throat Cardiovascular: Denies palpitation, chest discomfort or  lower extremity swelling Gastrointestinal:  Denies nausea, heartburn or change in bowel habits Skin: Denies abnormal skin rashes Lymphatics: Denies new lymphadenopathy or easy bruising Neurological:Denies numbness, tingling or new weaknesses Behavioral/Psych: Mood is stable, no new changes  All other systems were reviewed with the patient and are negative.  PHYSICAL EXAMINATION: ECOG PERFORMANCE STATUS: 0 -  Asymptomatic  Vitals:   12/03/16 0933  BP: 118/67  Pulse: (!) 102  Resp: 18  Temp: 98.5 F (36.9 C)   Filed Weights   12/03/16 0933  Weight: 131 lb 1.6 oz (59.5 kg)    GENERAL:alert, no distress and comfortable. She appears healthy SKIN: skin color, texture, turgor are normal, no rashes or significant lesions. Careful examination of her peripheral veins did not reveal any needle marks EYES: normal, conjunctiva are pink and non-injected, sclera clear OROPHARYNX:no exudate, no erythema and lips, buccal mucosa, and tongue normal  NECK: supple, thyroid normal size, non-tender, without nodularity LYMPH:  no palpable lymphadenopathy in the cervical, axillary or inguinal LUNGS: clear to auscultation and percussion with normal breathing effort HEART: regular rate & rhythm and no murmurs and no lower extremity edema ABDOMEN:abdomen soft, non-tender and normal bowel sounds Musculoskeletal:no cyanosis of digits and no clubbing  PSYCH: alert & oriented x 3 with fluent speech NEURO: no focal motor/sensory deficits  LABORATORY DATA:  I have reviewed the data as listed Recent Results (from the past 2160 hour(s))  CBC with Differential     Status: Abnormal   Collection Time: 11/18/16  9:44 AM  Result Value Ref Range   WBC 48.1 (H) 4.0 - 10.5 K/uL   RBC 3.66 (L) 3.87 - 5.11 MIL/uL   Hemoglobin 11.2 (L) 12.0 - 15.0 g/dL   HCT 33.2 (L) 36.0 - 46.0 %   MCV 90.7 78.0 - 100.0 fL   MCH 30.6 26.0 - 34.0 pg   MCHC 33.7 30.0 - 36.0 g/dL   RDW 11.4 (L) 11.5 - 15.5 %   Platelets 605 (H) 150 - 400 K/uL   Neutrophils Relative % 88 %   Lymphocytes Relative 5 %   Monocytes Relative 7 %   Eosinophils Relative 0 %   Basophils Relative 0 %   Neutro Abs 42.3 (H) 1.7 - 7.7 K/uL   Lymphs Abs 2.4 0.7 - 4.0 K/uL   Monocytes Absolute 3.4 (H) 0.1 - 1.0 K/uL   Eosinophils Absolute 0.0 0.0 - 0.7 K/uL   Basophils Absolute 0.0 0.0 - 0.1 K/uL   WBC Morphology MILD LEFT SHIFT (1-5% METAS, OCC MYELO, OCC BANDS)    Basic metabolic panel     Status: Abnormal   Collection Time: 11/18/16  9:44 AM  Result Value Ref Range   Sodium 138 135 - 145 mmol/L   Potassium 4.0 3.5 - 5.1 mmol/L   Chloride 99 (L) 101 - 111 mmol/L   CO2 26 22 - 32 mmol/L   Glucose, Bld 203 (H) 65 - 99 mg/dL   BUN 15 6 - 20 mg/dL   Creatinine, Ser 0.94 0.44 - 1.00 mg/dL   Calcium 9.2 8.9 - 10.3 mg/dL   GFR calc non Af Amer >60 >60 mL/min   GFR calc Af Amer >60 >60 mL/min    Comment: (NOTE) The eGFR has been calculated using the CKD EPI equation. This calculation has not been validated in all clinical situations. eGFR's persistently <60 mL/min signify possible Chronic Kidney Disease.    Anion gap 13 5 - 15  Magnesium     Status: None  Collection Time: 11/18/16  9:44 AM  Result Value Ref Range   Magnesium 2.2 1.7 - 2.4 mg/dL  Pathologist smear review     Status: None   Collection Time: 11/18/16  9:44 AM  Result Value Ref Range   Path Review Reviewed By Violet Baldy, M.D.     Comment: 01.03.2018 NORMOCYTIC ANEMIA, LEUKOCYTOSIS, THROMBOCYTOSIS.   Save smear     Status: None   Collection Time: 11/18/16  9:44 AM  Result Value Ref Range   Smear Review SMEAR STAINED AND AVAILABLE FOR REVIEW   Blood culture (routine x 2)     Status: None   Collection Time: 11/18/16 11:13 AM  Result Value Ref Range   Specimen Description BLOOD LEFT ANTECUBITAL    Special Requests BOTTLES DRAWN AEROBIC AND ANAEROBIC 5CC    Culture      NO GROWTH 5 DAYS Performed at [facility]    Report Status 11/23/2016 FINAL   I-Stat CG4 Lactic Acid, ED     Status: Abnormal   Collection Time: 11/18/16 11:17 AM  Result Value Ref Range   Lactic Acid, Venous 3.27 (HH) 0.5 - 1.9 mmol/L   Comment NOTIFIED PHYSICIAN   Blood culture (routine x 2)     Status: None   Collection Time: 11/18/16 11:39 AM  Result Value Ref Range   Specimen Description BLOOD LEFT FOREARM    Special Requests BOTTLES DRAWN AEROBIC AND ANAEROBIC 5CC    Culture      NO  GROWTH 5 DAYS Performed at [facility]    Report Status 11/23/2016 FINAL   Urinalysis, Routine w reflex microscopic     Status: Abnormal   Collection Time: 11/18/16  1:31 PM  Result Value Ref Range   Color, Urine YELLOW YELLOW   APPearance HAZY (A) CLEAR   Specific Gravity, Urine 1.010 1.005 - 1.030   pH 5.0 5.0 - 8.0   Glucose, UA >=500 (A) NEGATIVE mg/dL   Hgb urine dipstick NEGATIVE NEGATIVE   Bilirubin Urine NEGATIVE NEGATIVE   Ketones, ur 20 (A) NEGATIVE mg/dL   Protein, ur NEGATIVE NEGATIVE mg/dL   Nitrite NEGATIVE NEGATIVE   Leukocytes, UA NEGATIVE NEGATIVE   RBC / HPF 0-5 0 - 5 RBC/hpf   WBC, UA 6-30 0 - 5 WBC/hpf   Bacteria, UA RARE (A) NONE SEEN   Squamous Epithelial / LPF 0-5 (A) NONE SEEN   Mucous PRESENT    Hyaline Casts, UA PRESENT   Rapid urine drug screen (hospital performed)     Status: Abnormal   Collection Time: 11/18/16  1:31 PM  Result Value Ref Range   Opiates POSITIVE (A) NONE DETECTED   Cocaine NONE DETECTED NONE DETECTED   Benzodiazepines NONE DETECTED NONE DETECTED   Amphetamines NONE DETECTED NONE DETECTED   Tetrahydrocannabinol POSITIVE (A) NONE DETECTED   Barbiturates NONE DETECTED NONE DETECTED    Comment:        DRUG SCREEN FOR MEDICAL PURPOSES ONLY.  IF CONFIRMATION IS NEEDED FOR ANY PURPOSE, NOTIFY LAB WITHIN 5 DAYS.        LOWEST DETECTABLE LIMITS FOR URINE DRUG SCREEN Drug Class       Cutoff (ng/mL) Amphetamine      1000 Barbiturate      200 Benzodiazepine   426 Tricyclics       834 Opiates          300 Cocaine          300 THC  50   Pregnancy, urine     Status: None   Collection Time: 11/18/16  1:31 PM  Result Value Ref Range   Preg Test, Ur NEGATIVE NEGATIVE    Comment:        THE SENSITIVITY OF THIS METHODOLOGY IS >20 mIU/mL.   I-Stat CG4 Lactic Acid, ED     Status: None   Collection Time: 11/18/16  2:34 PM  Result Value Ref Range   Lactic Acid, Venous 0.86 0.5 - 1.9 mmol/L  Comprehensive metabolic  panel     Status: Abnormal   Collection Time: 11/21/16  6:57 AM  Result Value Ref Range   Sodium 139 135 - 145 mmol/L   Potassium 2.8 (L) 3.5 - 5.1 mmol/L   Chloride 98 (L) 101 - 111 mmol/L   CO2 28 22 - 32 mmol/L   Glucose, Bld 181 (H) 65 - 99 mg/dL   BUN 10 6 - 20 mg/dL   Creatinine, Ser 0.91 0.44 - 1.00 mg/dL   Calcium 9.1 8.9 - 10.3 mg/dL   Total Protein 7.1 6.5 - 8.1 g/dL   Albumin 4.1 3.5 - 5.0 g/dL   AST 51 (H) 15 - 41 U/L   ALT 63 (H) 14 - 54 U/L   Alkaline Phosphatase 59 38 - 126 U/L   Total Bilirubin 0.4 0.3 - 1.2 mg/dL   GFR calc non Af Amer >60 >60 mL/min   GFR calc Af Amer >60 >60 mL/min    Comment: (NOTE) The eGFR has been calculated using the CKD EPI equation. This calculation has not been validated in all clinical situations. eGFR's persistently <60 mL/min signify possible Chronic Kidney Disease.    Anion gap 13 5 - 15  Salicylate level     Status: None   Collection Time: 11/21/16  6:57 AM  Result Value Ref Range   Salicylate Lvl <8.5 2.8 - 30.0 mg/dL  Acetaminophen level     Status: Abnormal   Collection Time: 11/21/16  6:57 AM  Result Value Ref Range   Acetaminophen (Tylenol), Serum <10 (L) 10 - 30 ug/mL    Comment:        THERAPEUTIC CONCENTRATIONS VARY SIGNIFICANTLY. A RANGE OF 10-30 ug/mL MAY BE AN EFFECTIVE CONCENTRATION FOR MANY PATIENTS. HOWEVER, SOME ARE BEST TREATED AT CONCENTRATIONS OUTSIDE THIS RANGE. ACETAMINOPHEN CONCENTRATIONS >150 ug/mL AT 4 HOURS AFTER INGESTION AND >50 ug/mL AT 12 HOURS AFTER INGESTION ARE OFTEN ASSOCIATED WITH TOXIC REACTIONS.   CBC with Differential     Status: Abnormal   Collection Time: 11/21/16  6:57 AM  Result Value Ref Range   WBC 22.2 (H) 4.0 - 10.5 K/uL   RBC 3.97 3.87 - 5.11 MIL/uL   Hemoglobin 12.0 12.0 - 15.0 g/dL   HCT 35.6 (L) 36.0 - 46.0 %   MCV 89.7 78.0 - 100.0 fL   MCH 30.2 26.0 - 34.0 pg   MCHC 33.7 30.0 - 36.0 g/dL   RDW 11.2 (L) 11.5 - 15.5 %   Platelets 462 (H) 150 - 400 K/uL    Neutrophils Relative % 84 %   Neutro Abs 18.9 (H) 1.7 - 7.7 K/uL   Lymphocytes Relative 10 %   Lymphs Abs 2.1 0.7 - 4.0 K/uL   Monocytes Relative 5 %   Monocytes Absolute 1.0 0.1 - 1.0 K/uL   Eosinophils Relative 1 %   Eosinophils Absolute 0.1 0.0 - 0.7 K/uL   Basophils Relative 0 %   Basophils Absolute 0.0 0.0 - 0.1 K/uL  Urine rapid  drug screen (hosp performed)     Status: None   Collection Time: 11/21/16  6:59 AM  Result Value Ref Range   Opiates NONE DETECTED NONE DETECTED   Cocaine NONE DETECTED NONE DETECTED   Benzodiazepines NONE DETECTED NONE DETECTED   Amphetamines NONE DETECTED NONE DETECTED   Tetrahydrocannabinol NONE DETECTED NONE DETECTED   Barbiturates NONE DETECTED NONE DETECTED    Comment:        DRUG SCREEN FOR MEDICAL PURPOSES ONLY.  IF CONFIRMATION IS NEEDED FOR ANY PURPOSE, NOTIFY LAB WITHIN 5 DAYS.        LOWEST DETECTABLE LIMITS FOR URINE DRUG SCREEN Drug Class       Cutoff (ng/mL) Amphetamine      1000 Barbiturate      200 Benzodiazepine   381 Tricyclics       829 Opiates          300 Cocaine          300 THC              50   Urinalysis, Routine w reflex microscopic     Status: Abnormal   Collection Time: 11/21/16  6:59 AM  Result Value Ref Range   Color, Urine STRAW (A) YELLOW   APPearance CLEAR CLEAR   Specific Gravity, Urine 1.005 1.005 - 1.030   pH 6.0 5.0 - 8.0   Glucose, UA >=500 (A) NEGATIVE mg/dL   Hgb urine dipstick NEGATIVE NEGATIVE   Bilirubin Urine NEGATIVE NEGATIVE   Ketones, ur 5 (A) NEGATIVE mg/dL   Protein, ur NEGATIVE NEGATIVE mg/dL   Nitrite NEGATIVE NEGATIVE   Leukocytes, UA NEGATIVE NEGATIVE   RBC / HPF 0-5 0 - 5 RBC/hpf   WBC, UA 0-5 0 - 5 WBC/hpf   Bacteria, UA NONE SEEN NONE SEEN   Squamous Epithelial / LPF 0-5 (A) NONE SEEN   Mucous PRESENT    Hyaline Casts, UA PRESENT   I-Stat beta hCG blood, ED     Status: None   Collection Time: 11/21/16  7:13 AM  Result Value Ref Range   I-stat hCG, quantitative <5.0 <5  mIU/mL   Comment 3            Comment:   GEST. AGE      CONC.  (mIU/mL)   <=1 WEEK        5 - 50     2 WEEKS       50 - 500     3 WEEKS       100 - 10,000     4 WEEKS     1,000 - 30,000        FEMALE AND NON-PREGNANT FEMALE:     LESS THAN 5 mIU/mL   Hemoglobin A1c     Status: None   Collection Time: 11/22/16  6:33 AM  Result Value Ref Range   Hgb A1c MFr Bld 5.0 4.8 - 5.6 %    Comment: (NOTE)         Pre-diabetes: 5.7 - 6.4         Diabetes: >6.4         Glycemic control for adults with diabetes: <7.0    Mean Plasma Glucose 97 mg/dL    Comment: (NOTE) Performed At: [facility] [address] [place], [place] [id number] Lindon Romp MD [id number] Performed at [facility]   Lipid panel     Status: Abnormal   Collection Time: 11/22/16  6:33 AM  Result Value Ref Range   Cholesterol 148 0 - 200 mg/dL   Triglycerides 129 <150 mg/dL   HDL 39 (L) >40 mg/dL   Total CHOL/HDL Ratio 3.8 RATIO   VLDL 26 0 - 40 mg/dL   LDL Cholesterol 83 0 - 99 mg/dL    Comment:        Total Cholesterol/HDL:CHD Risk Coronary Heart Disease Risk Table                     Men   Women  1/2 Average Risk   3.4   3.3  Average Risk       5.0   4.4  2 X Average Risk   9.6   7.1  3 X Average Risk  23.4   11.0        Use the calculated Patient Ratio above and the CHD Risk Table to determine the patient's CHD Risk.        ATP III CLASSIFICATION (LDL):  <100     mg/dL   Optimal  100-129  mg/dL   Near or Above                    Optimal  130-159  mg/dL   Borderline  160-189  mg/dL   High  >190     mg/dL   Very High Performed at [facility]   TSH     Status: Abnormal   Collection Time: 11/22/16  6:33 AM  Result Value Ref Range   TSH 0.156 (L) 0.350 - 4.500 uIU/mL    Comment: Performed by a 3rd Generation assay with a functional sensitivity of <=0.01 uIU/mL. Performed at [facility]   Comprehensive metabolic panel     Status: Abnormal    Collection Time: 11/27/16  4:38 AM  Result Value Ref Range   Sodium 134 (L) 135 - 145 mmol/L   Potassium 3.6 3.5 - 5.1 mmol/L   Chloride 98 (L) 101 - 111 mmol/L   CO2 25 22 - 32 mmol/L   Glucose, Bld 141 (H) 65 - 99 mg/dL   BUN 11 6 - 20 mg/dL   Creatinine, Ser 0.90 0.44 - 1.00 mg/dL   Calcium 9.0 8.9 - 10.3 mg/dL   Total Protein 6.9 6.5 - 8.1 g/dL   Albumin 4.0 3.5 - 5.0 g/dL   AST 69 (H) 15 - 41 U/L   ALT 55 (H) 14 - 54 U/L   Alkaline Phosphatase 58 38 - 126 U/L   Total Bilirubin 0.8 0.3 - 1.2 mg/dL   GFR calc non Af Amer >60 >60 mL/min   GFR calc Af Amer >60 >60 mL/min    Comment: (NOTE) The eGFR has been calculated using the CKD EPI equation. This calculation has not been validated in all clinical situations. eGFR's persistently <60 mL/min signify possible Chronic Kidney Disease.    Anion gap 11 5 - 15  Ethanol     Status: None   Collection Time: 11/27/16  4:38 AM  Result Value Ref Range   Alcohol, Ethyl (B) <5 <5 mg/dL    Comment:        LOWEST DETECTABLE LIMIT FOR SERUM ALCOHOL IS 5 mg/dL FOR MEDICAL PURPOSES ONLY   Salicylate level     Status: None   Collection Time: 11/27/16  4:38 AM  Result Value Ref Range   Salicylate Lvl <7.6 2.8 - 30.0 mg/dL  Acetaminophen level     Status: Abnormal   Collection  Time: 11/27/16  4:38 AM  Result Value Ref Range   Acetaminophen (Tylenol), Serum <10 (L) 10 - 30 ug/mL    Comment:        THERAPEUTIC CONCENTRATIONS VARY SIGNIFICANTLY. A RANGE OF 10-30 ug/mL MAY BE AN EFFECTIVE CONCENTRATION FOR MANY PATIENTS. HOWEVER, SOME ARE BEST TREATED AT CONCENTRATIONS OUTSIDE THIS RANGE. ACETAMINOPHEN CONCENTRATIONS >150 ug/mL AT 4 HOURS AFTER INGESTION AND >50 ug/mL AT 12 HOURS AFTER INGESTION ARE OFTEN ASSOCIATED WITH TOXIC REACTIONS.   cbc     Status: Abnormal   Collection Time: 11/27/16  4:38 AM  Result Value Ref Range   WBC 39.9 (H) 4.0 - 10.5 K/uL    Comment: REPEATED TO VERIFY   RBC 4.10 3.87 - 5.11 MIL/uL   Hemoglobin  12.5 12.0 - 15.0 g/dL   HCT 37.6 36.0 - 46.0 %   MCV 91.7 78.0 - 100.0 fL   MCH 30.5 26.0 - 34.0 pg   MCHC 33.2 30.0 - 36.0 g/dL   RDW 11.6 11.5 - 15.5 %   Platelets 453 (H) 150 - 400 K/uL  Rapid urine drug screen (hospital performed)     Status: Abnormal   Collection Time: 11/27/16  4:43 AM  Result Value Ref Range   Opiates POSITIVE (A) NONE DETECTED   Cocaine NONE DETECTED NONE DETECTED   Benzodiazepines NONE DETECTED NONE DETECTED   Amphetamines NONE DETECTED NONE DETECTED   Tetrahydrocannabinol NONE DETECTED NONE DETECTED   Barbiturates NONE DETECTED NONE DETECTED    Comment:        DRUG SCREEN FOR MEDICAL PURPOSES ONLY.  IF CONFIRMATION IS NEEDED FOR ANY PURPOSE, NOTIFY LAB WITHIN 5 DAYS.        LOWEST DETECTABLE LIMITS FOR URINE DRUG SCREEN Drug Class       Cutoff (ng/mL) Amphetamine      1000 Barbiturate      200 Benzodiazepine   407 Tricyclics       680 Opiates          300 Cocaine          300 THC              50   Pregnancy, urine     Status: None   Collection Time: 11/27/16  4:43 AM  Result Value Ref Range   Preg Test, Ur NEGATIVE NEGATIVE    Comment:        THE SENSITIVITY OF THIS METHODOLOGY IS >20 mIU/mL.   Culture, blood (Routine X 2) w Reflex to ID Panel     Status: None   Collection Time: 11/27/16  7:25 AM  Result Value Ref Range   Specimen Description BLOOD LEFT ANTECUBITAL    Special Requests BOTTLES DRAWN AEROBIC AND ANAEROBIC  10CC    Culture NO GROWTH 5 DAYS    Report Status 12/02/2016 FINAL   Culture, blood (Routine X 2) w Reflex to ID Panel     Status: None   Collection Time: 11/27/16  7:30 AM  Result Value Ref Range   Specimen Description BLOOD LEFT HAND    Special Requests BOTTLES DRAWN AEROBIC ONLY  10CC    Culture NO GROWTH 5 DAYS    Report Status 12/02/2016 FINAL   CBC with Differential/Platelet     Status: None   Collection Time: 12/03/16 11:02 AM  Result Value Ref Range   WBC 6.9 3.9 - 10.3 10e3/uL   NEUT# 3.7 1.5 - 6.5 10e3/uL    HGB 14.2 11.6 - 15.9 g/dL   HCT  42.0 34.8 - 46.6 %   Platelets 330 145 - 400 10e3/uL   MCV 92.2 79.5 - 101.0 fL   MCH 31.1 25.1 - 34.0 pg   MCHC 33.7 31.5 - 36.0 g/dL   RBC 4.56 3.70 - 5.45 10e6/uL   RDW 12.2 11.2 - 14.5 %   lymph# 2.5 0.9 - 3.3 10e3/uL   MONO# 0.6 0.1 - 0.9 10e3/uL   Eosinophils Absolute 0.1 0.0 - 0.5 10e3/uL   Basophils Absolute 0.0 0.0 - 0.1 10e3/uL   NEUT% 53.0 38.4 - 76.8 %   LYMPH% 36.3 14.0 - 49.7 %   MONO% 8.1 0.0 - 14.0 %   EOS% 2.2 0.0 - 7.0 %   BASO% 0.4 0.0 - 2.0 %  Comprehensive metabolic panel     Status: None   Collection Time: 12/03/16 11:02 AM  Result Value Ref Range   Sodium 138 136 - 145 mEq/L   Potassium 4.4 3.5 - 5.1 mEq/L   Chloride 103 98 - 109 mEq/L   CO2 25 22 - 29 mEq/L   Glucose 78 70 - 140 mg/dl    Comment: Glucose reference range is for nonfasting patients. Fasting glucose reference range is 70- 100.   BUN 9.0 7.0 - 26.0 mg/dL   Creatinine 0.8 0.6 - 1.1 mg/dL   Total Bilirubin 0.53 0.20 - 1.20 mg/dL   Alkaline Phosphatase 69 40 - 150 U/L   AST 19 5 - 34 U/L   ALT 22 0 - 55 U/L   Total Protein 8.0 6.4 - 8.3 g/dL   Albumin 4.4 3.5 - 5.0 g/dL   Calcium 10.1 8.4 - 10.4 mg/dL   Anion Gap 10 3 - 11 mEq/L   EGFR >90 >90 ml/min/1.73 m2    Comment: eGFR is calculated using the CKD-EPI Creatinine Equation (2009)  Urinalysis, Microscopic - [facility]     Status: None   Collection Time: 12/03/16 11:02 AM  Result Value Ref Range   Glucose Negative Negative mg/dL   Bilirubin (Urine) Negative Negative   Ketones Negative Negative mg/dL   Specific Gravity, Urine 1.005 1.003 - 1.035   Blood Negative Negative   pH 7.0 4.6 - 8.0   Protein Negative Negative- <30 mg/dL   Urobilinogen, UR 0.2 0.2 - 1 mg/dL   Nitrite Negative Negative   Leukocyte Esterase Negative Negative   RBC / HPF 0-2 0 - 2   WBC, UA 3-6 0-2;Negative   Bacteria, UA Moderate Negative- Trace   Epithelial Cells Moderate Negative- Few  Smear     Status: None   Collection  Time: 12/03/16 11:02 AM  Result Value Ref Range   Smear Result Smear Available   TSH     Status: None   Collection Time: 12/03/16 11:02 AM  Result Value Ref Range   TSH 0.659 0.308 - 3.960 m(IU)/L    RADIOGRAPHIC STUDIES: I have personally reviewed the radiological images as listed and agreed with the findings in the report. Dg Chest 2 View  Result Date: 12/03/2016 CLINICAL DATA:  Leukocytosis and cough. EXAM: CHEST  2 VIEW COMPARISON:  November 27, 2016 FINDINGS: The heart size and mediastinal contours are within normal limits. Both lungs are clear. The visualized skeletal structures are unremarkable. IMPRESSION: No active cardiopulmonary disease. Electronically Signed   By: Abelardo Diesel M.D.   On: 12/03/2016 11:27   Dg Chest 2 View  Result Date: 11/27/2016 CLINICAL DATA:  26 year old female with cough for 1 month. Initial encounter. EXAM: CHEST  2 VIEW COMPARISON:  11/18/2016. FINDINGS: Larger lung volumes, normal today. Normal cardiac size and mediastinal contours. Visualized tracheal air column is within normal limits. No pneumothorax, pulmonary edema, pleural effusion or consolidation. Suggestion of mild central peribronchial thickening, most apparent about the left hilum. Otherwise no confluent pulmonary opacity. No osseous abnormality identified. Negative visible bowel gas pattern. IMPRESSION: Possible central peribronchial thickening such as due to viral or reactive airway disease, but otherwise negative chest. Electronically Signed   By: Genevie Ann M.D.   On: 11/27/2016 07:42   Dg Chest 2 View  Result Date: 11/18/2016 CLINICAL DATA:  Drug overdose, tachycardia and elevated white blood cell count. EXAM: CHEST  2 VIEW COMPARISON:  None. FINDINGS: The heart size and mediastinal contours are within normal limits. There is no evidence of pulmonary edema, consolidation, pneumothorax, nodule or pleural fluid. The visualized skeletal structures are unremarkable. IMPRESSION: No active  cardiopulmonary disease. Electronically Signed   By: Aletta Edouard M.D.   On: 11/18/2016 12:09    ASSESSMENT & PLAN  Leukocytosis I have obtained a signed consent to release information to [place] from her physician from [facility] regarding blood tests from last year Diffierential diagnosis included possible reactive leukocytosis from infection, recent drug overdose or bone marrow malignancy With her history of IV drug use, I would be concerned for possible infective endocarditis or other localizing infection such as abscess I will draw simple blood work today and will review outside records prior to calling the patient with results Depending on the test results, that would determine the next plan of care  Low TSH level She had recent abnormal TSH level I plan to repeat full thyroid function test today  Opioid use disorder, moderate, dependence (Deepstep) She had multiple recent drug overdoses from heroin use I spoke with the patient and tried to determine whether she may have coexisting mental health issues such as depression The patient denies depression She is aware of the danger of repeated drug overdose She states her recent drug overdose was precipitated by stress She stated she has friends and family members that she could talk to and could help her in other ways to 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 Urine culture    Standing Status:   Future    Number of Occurrences:   1    Standing Expiration Date:   01/07/2018  . DG Chest 2 View    Standing Status:   Future    Number of Occurrences:   1    Standing Expiration Date:   01/07/2018    Order  Specific Question:   Reason for exam:    Answer:   leukocytosis, cough    Order Specific Question:   Preferred imaging location?    Answer:   [facility]  . CBC with Differential/Platelet    Standing Status:   Future    Number of Occurrences:   1    Standing Expiration Date:   01/07/2018  . Comprehensive metabolic panel    Standing Status:   Future    Number of Occurrences:   1    Standing Expiration Date:   01/07/2018  . Urinalysis, Microscopic - [facility]  Standing Status:   Future    Number of Occurrences:   1    Standing Expiration Date:   01/07/2018  . Sedimentation rate    Standing Status:   Future    Number of Occurrences:   1    Standing Expiration Date:   01/07/2018  . Smear    Standing Status:   Future    Number of Occurrences:   1    Standing Expiration Date:   01/07/2018  . T4, free    Standing Status:   Future    Number of Occurrences:   1    Standing Expiration Date:   01/07/2018  . TSH    Standing Status:   Future    Number of Occurrences:   1    Standing Expiration Date:   01/07/2018    All questions were answered. The patient knows to call the clinic with any problems, questions or concerns. I spent 40 minutes counseling the patient face to face. The total time spent in the appointment was 60 minutes and more than 50% was on counseling.     Heath Lark, MD 12/03/2016 12:51 PM

## 2016-12-03 NOTE — Assessment & Plan Note (Signed)
She had recent productive cough I recommend repeating a chest x-ray to make sure she is not developing pneumonia

## 2016-12-03 NOTE — Telephone Encounter (Signed)
Patient sent to lab. Per 1/17 los no f/u

## 2016-12-05 ENCOUNTER — Telehealth: Payer: Self-pay | Admitting: Hematology and Oncology

## 2016-12-05 LAB — SEDIMENTATION RATE: SED RATE: 2 mm/h (ref 0–32)

## 2016-12-05 LAB — URINE CULTURE: Organism ID, Bacteria: NO GROWTH

## 2016-12-05 LAB — T4, FREE: FREE T4: 1.69 ng/dL (ref 0.82–1.77)

## 2016-12-05 NOTE — Telephone Encounter (Signed)
I review all test results with the patient Chest x-ray and blood work were normal She does not need return appointment

## 2018-12-04 IMAGING — DX DG CHEST 2V
2 series · 2 of 2 positions shown · non-contrast
Comparison: November 27, 2016

CLINICAL DATA: Leukocytosis and cough.

EXAM:
CHEST  2 VIEW

[chest pa]
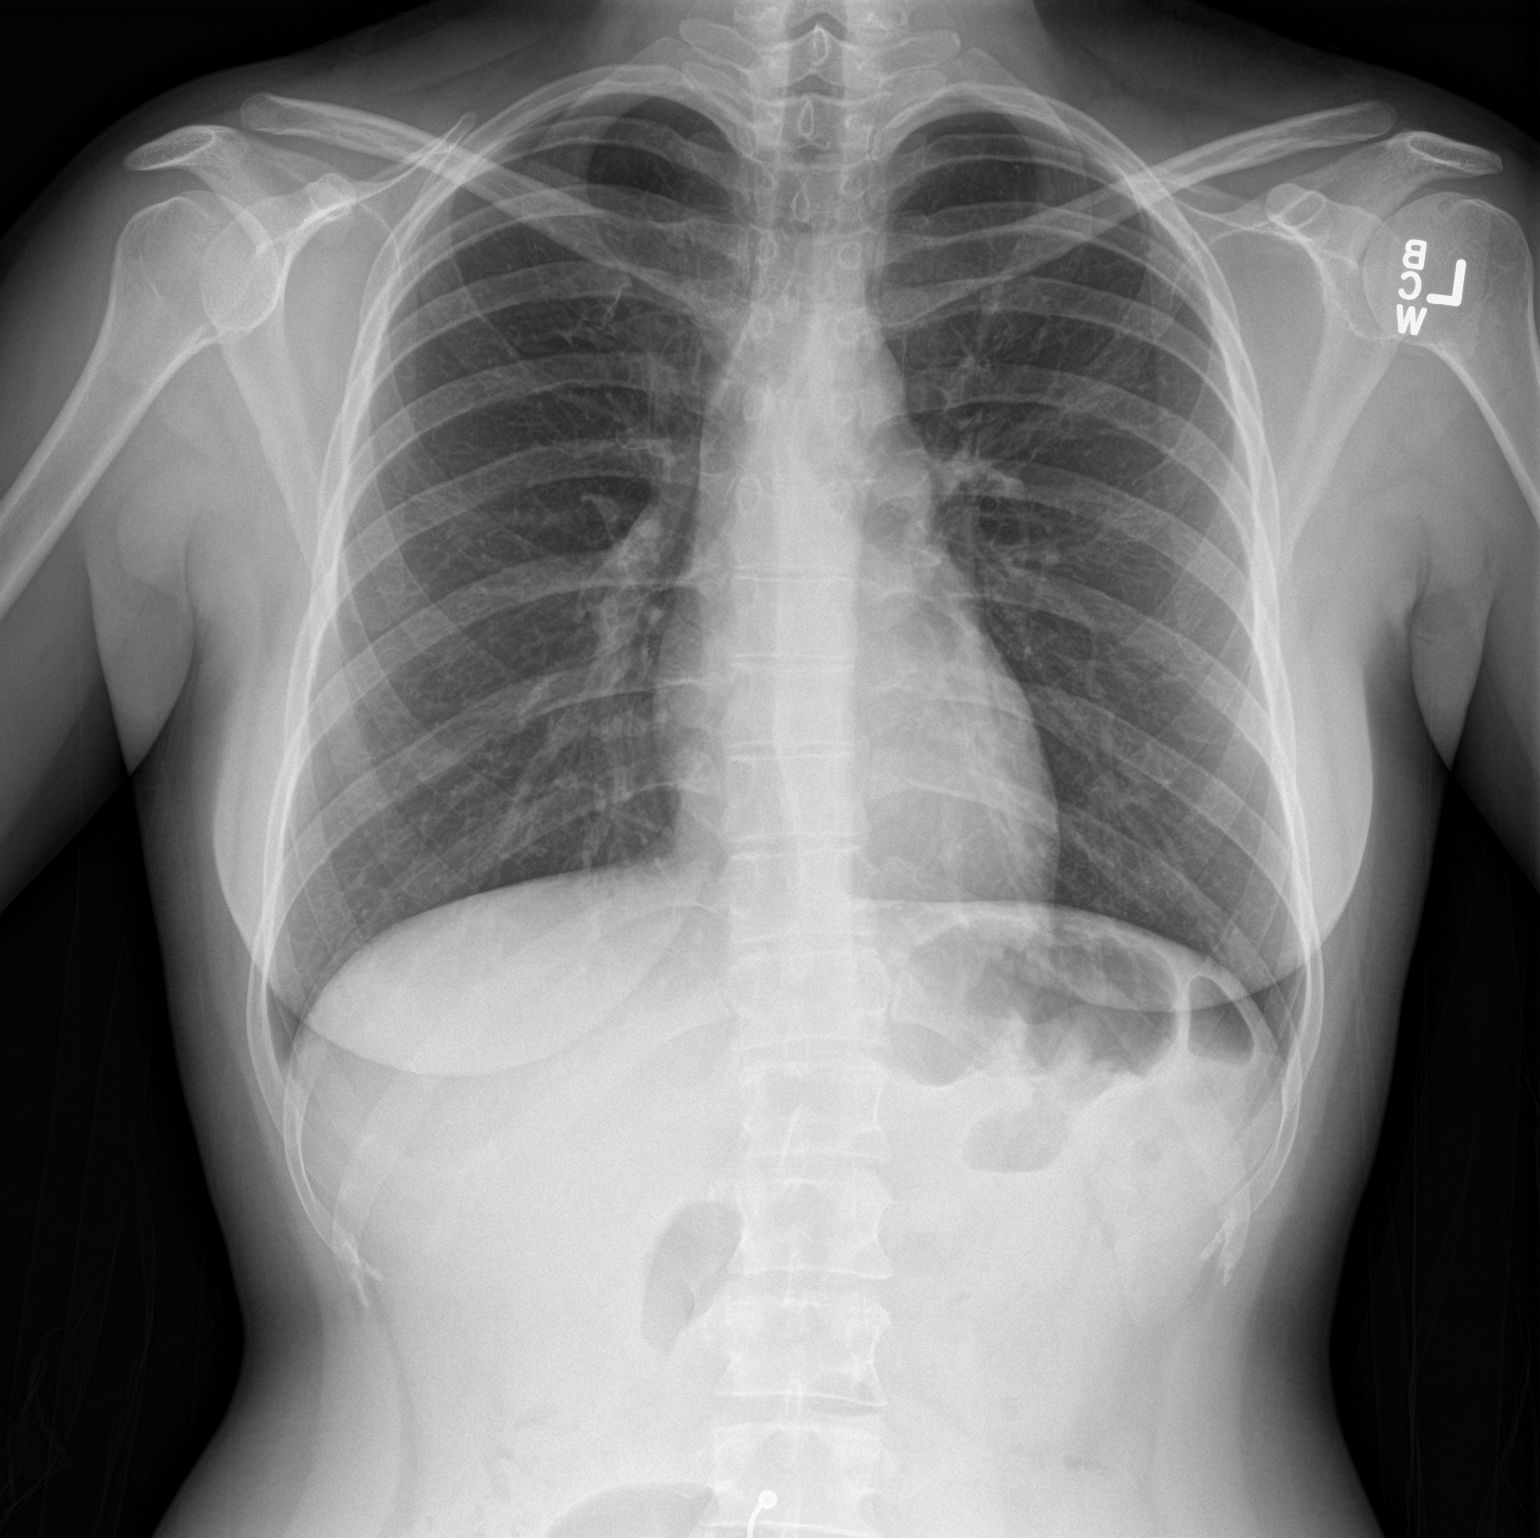

[chest lat]
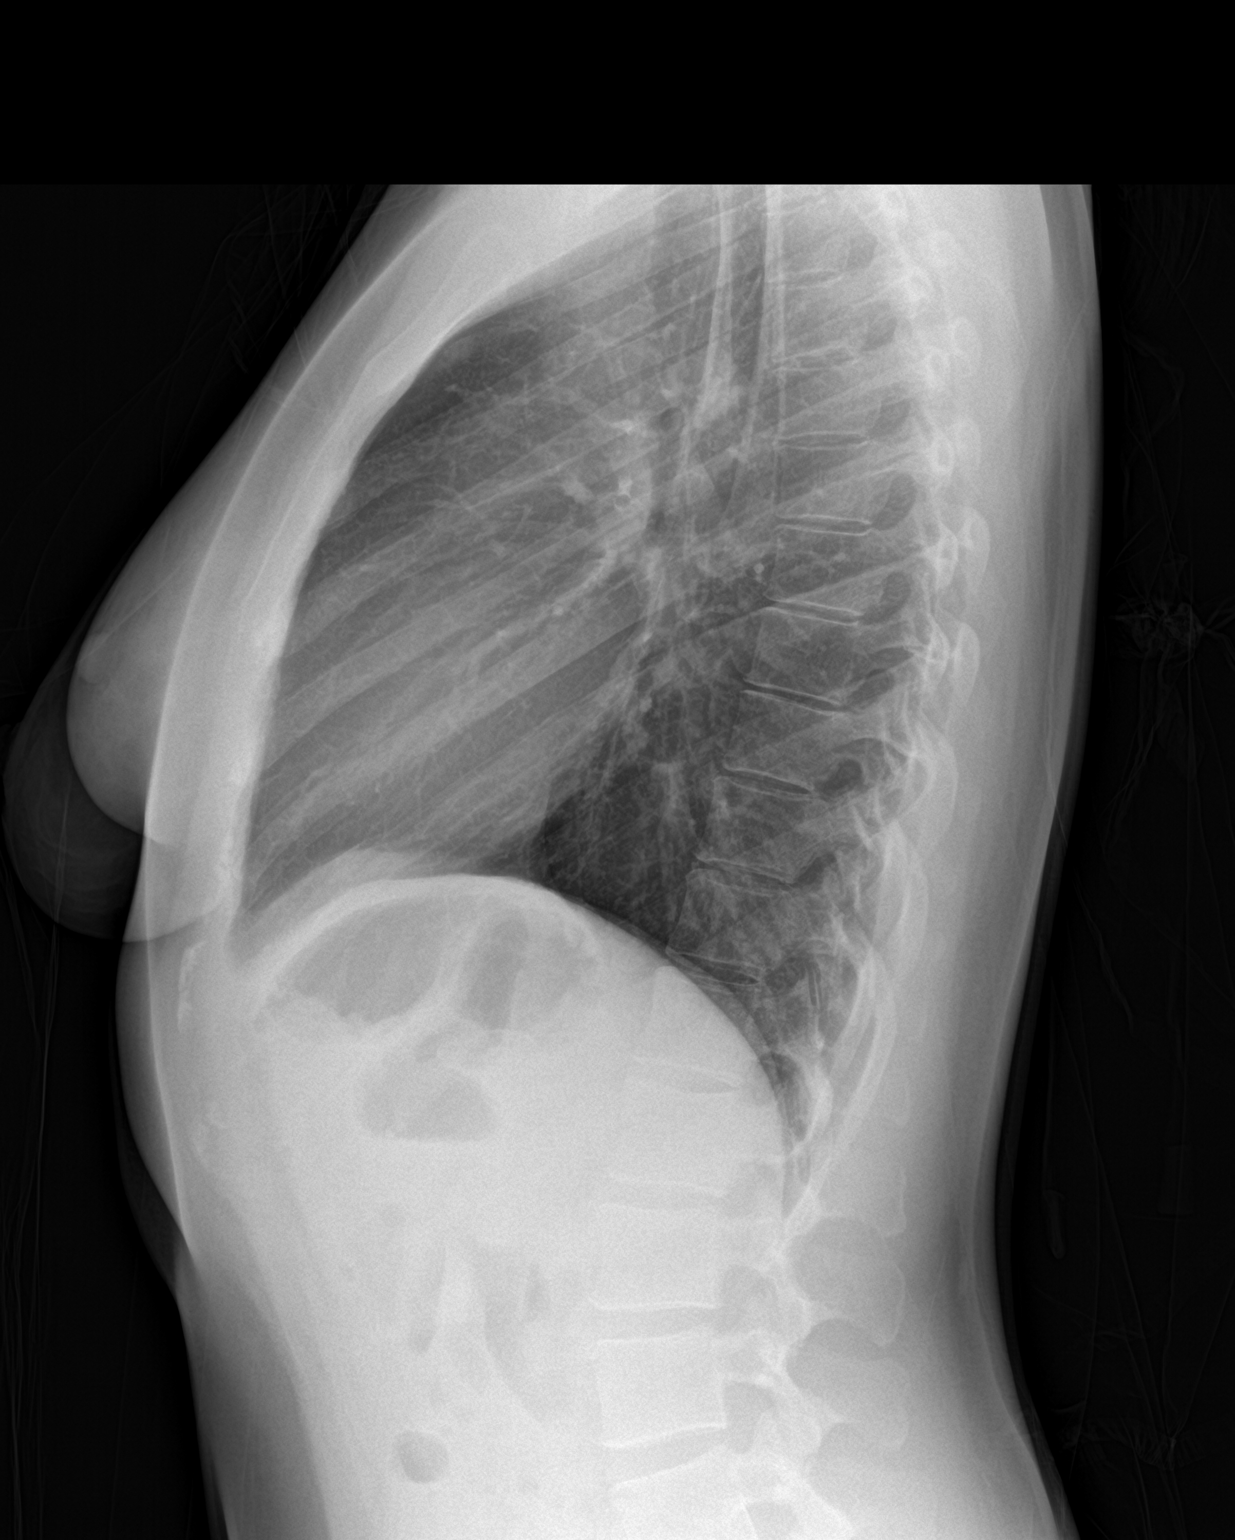

[2 of 2 positions shown; findings below may reference images not displayed]

FINDINGS: The heart size and mediastinal contours are within normal limits.
Both lungs are clear. The visualized skeletal structures are
unremarkable.
IMPRESSION: No active cardiopulmonary disease.
